# Patient Record
Sex: Female | Born: 1955 | Race: White | Hispanic: No | Marital: Single | Smoking: Never smoker
Health system: Southern US, Community
[De-identification: ages and names within clinical notes are randomized; demographics above are authoritative.]

## PROBLEM LIST (undated history)

## (undated) DIAGNOSIS — K900 Celiac disease: Secondary | ICD-10-CM

## (undated) DIAGNOSIS — M810 Age-related osteoporosis without current pathological fracture: Secondary | ICD-10-CM

## (undated) DIAGNOSIS — Z780 Asymptomatic menopausal state: Secondary | ICD-10-CM

## (undated) DIAGNOSIS — M1990 Unspecified osteoarthritis, unspecified site: Secondary | ICD-10-CM

## (undated) DIAGNOSIS — D509 Iron deficiency anemia, unspecified: Secondary | ICD-10-CM

## (undated) DIAGNOSIS — N200 Calculus of kidney: Secondary | ICD-10-CM

## (undated) DIAGNOSIS — R011 Cardiac murmur, unspecified: Secondary | ICD-10-CM

## (undated) DIAGNOSIS — N3281 Overactive bladder: Secondary | ICD-10-CM

## (undated) HISTORY — DX: Age-related osteoporosis without current pathological fracture: M81.0

## (undated) HISTORY — PX: CHOLECYSTECTOMY: SHX55

## (undated) HISTORY — PX: ULNAR NERVE TRANSPOSITION: SHX2595

## (undated) HISTORY — DX: Overactive bladder: N32.81

## (undated) HISTORY — DX: Unspecified osteoarthritis, unspecified site: M19.90

## (undated) HISTORY — DX: Asymptomatic menopausal state: Z78.0

## (undated) HISTORY — DX: Calculus of kidney: N20.0

## (undated) HISTORY — DX: Iron deficiency anemia, unspecified: D50.9

---

## 2014-02-18 ENCOUNTER — Emergency Department: Payer: Self-pay | Admitting: Internal Medicine

## 2014-03-01 ENCOUNTER — Emergency Department: Payer: Self-pay | Admitting: Emergency Medicine

## 2014-08-13 ENCOUNTER — Encounter (HOSPITAL_COMMUNITY): Payer: Self-pay | Admitting: *Deleted

## 2014-08-13 ENCOUNTER — Inpatient Hospital Stay (HOSPITAL_COMMUNITY)
Admission: EM | Admit: 2014-08-13 | Discharge: 2014-08-18 | DRG: 965 | Disposition: A | Discharge status: Home Health | Payer: Worker's Compensation | Source: Other Acute Inpatient Hospital | Attending: General Surgery | Admitting: General Surgery

## 2014-08-13 ENCOUNTER — Emergency Department: Payer: Self-pay | Admitting: Emergency Medicine

## 2014-08-13 ENCOUNTER — Inpatient Hospital Stay (HOSPITAL_COMMUNITY): Payer: Worker's Compensation

## 2014-08-13 DIAGNOSIS — S3723XA Laceration of bladder, initial encounter: Secondary | ICD-10-CM | POA: Diagnosis present

## 2014-08-13 DIAGNOSIS — W3189XA Contact with other specified machinery, initial encounter: Secondary | ICD-10-CM

## 2014-08-13 DIAGNOSIS — R102 Pelvic and perineal pain: Secondary | ICD-10-CM | POA: Diagnosis present

## 2014-08-13 DIAGNOSIS — S32810A Multiple fractures of pelvis with stable disruption of pelvic ring, initial encounter for closed fracture: Secondary | ICD-10-CM

## 2014-08-13 DIAGNOSIS — D509 Iron deficiency anemia, unspecified: Secondary | ICD-10-CM | POA: Diagnosis present

## 2014-08-13 DIAGNOSIS — S3720XA Unspecified injury of bladder, initial encounter: Secondary | ICD-10-CM | POA: Diagnosis present

## 2014-08-13 DIAGNOSIS — IMO0002 Reserved for concepts with insufficient information to code with codable children: Secondary | ICD-10-CM

## 2014-08-13 DIAGNOSIS — K900 Celiac disease: Secondary | ICD-10-CM | POA: Diagnosis present

## 2014-08-13 DIAGNOSIS — S3219XA Other fracture of sacrum, initial encounter for closed fracture: Secondary | ICD-10-CM | POA: Diagnosis present

## 2014-08-13 DIAGNOSIS — R319 Hematuria, unspecified: Secondary | ICD-10-CM | POA: Diagnosis present

## 2014-08-13 DIAGNOSIS — S3282XA Multiple fractures of pelvis without disruption of pelvic ring, initial encounter for closed fracture: Secondary | ICD-10-CM | POA: Diagnosis present

## 2014-08-13 DIAGNOSIS — S329XXA Fracture of unspecified parts of lumbosacral spine and pelvis, initial encounter for closed fracture: Secondary | ICD-10-CM

## 2014-08-13 DIAGNOSIS — S3991XA Unspecified injury of abdomen, initial encounter: Secondary | ICD-10-CM | POA: Diagnosis present

## 2014-08-13 HISTORY — DX: Celiac disease: K90.0

## 2014-08-13 HISTORY — DX: Cardiac murmur, unspecified: R01.1

## 2014-08-13 HISTORY — DX: Multiple fractures of pelvis without disruption of pelvic ring, initial encounter for closed fracture: S32.82XA

## 2014-08-13 LAB — CBC WITH DIFFERENTIAL/PLATELET
BASOS ABS: 0 10*3/uL (ref 0.0–0.1)
Basophil %: 0.2 %
EOS PCT: 0.1 %
Eosinophil #: 0 10*3/uL (ref 0.0–0.7)
HCT: 25 % — ABNORMAL LOW (ref 35.0–47.0)
HGB: 7.1 g/dL — ABNORMAL LOW (ref 12.0–16.0)
Lymphocyte #: 0.6 10*3/uL — ABNORMAL LOW (ref 1.0–3.6)
Lymphocyte %: 3 %
MCH: 19.2 pg — ABNORMAL LOW (ref 26.0–34.0)
MCHC: 28.6 g/dL — ABNORMAL LOW (ref 32.0–36.0)
MCV: 67 fL — ABNORMAL LOW (ref 80–100)
MONOS PCT: 5 %
Monocyte #: 1 x10 3/mm — ABNORMAL HIGH (ref 0.2–0.9)
NEUTROS PCT: 91.7 %
Neutrophil #: 18.2 10*3/uL — ABNORMAL HIGH (ref 1.4–6.5)
PLATELETS: 382 10*3/uL (ref 150–440)
RBC: 3.72 10*6/uL — AB (ref 3.80–5.20)
RDW: 20 % — ABNORMAL HIGH (ref 11.5–14.5)
WBC: 19.8 10*3/uL — AB (ref 3.6–11.0)

## 2014-08-13 LAB — BASIC METABOLIC PANEL
Anion Gap: 7 (ref 7–16)
BUN: 19 mg/dL — AB (ref 7–18)
CREATININE: 0.85 mg/dL (ref 0.60–1.30)
Calcium, Total: 7.8 mg/dL — ABNORMAL LOW (ref 8.5–10.1)
Chloride: 109 mmol/L — ABNORMAL HIGH (ref 98–107)
Co2: 24 mmol/L (ref 21–32)
EGFR (African American): >60
EGFR (Non-African Amer.): >60
GLUCOSE: 132 mg/dL — AB (ref 65–99)
OSMOLALITY: 284 (ref 275–301)
Potassium: 3.5 mmol/L (ref 3.5–5.1)
Sodium: 140 mmol/L (ref 136–145)

## 2014-08-13 LAB — PROTIME-INR
INR: 1.1
Prothrombin Time: 13.8 secs (ref 11.5–14.7)

## 2014-08-13 LAB — APTT: Activated PTT: 25.9 secs (ref 23.6–35.9)

## 2014-08-13 LAB — MRSA PCR SCREENING: MRSA by PCR: POSITIVE — AB

## 2014-08-13 MED ORDER — OXYCODONE HCL 5 MG PO TABS
5.0000 mg | ORAL_TABLET | ORAL | Status: DC | PRN
  Administered 2014-08-13: 15 mg via ORAL
  Administered 2014-08-13: 10 mg via ORAL
  Administered 2014-08-13: 5 mg via ORAL
  Administered 2014-08-14 – 2014-08-15 (×4): 10 mg via ORAL
  Administered 2014-08-15 – 2014-08-17 (×10): 15 mg via ORAL
  Administered 2014-08-17 – 2014-08-18 (×2): 10 mg via ORAL
  Administered 2014-08-18 (×2): 15 mg via ORAL
  Administered 2014-08-18: 10 mg via ORAL
  Filled 2014-08-13 (×4): qty 3
  Filled 2014-08-13 (×2): qty 2
  Filled 2014-08-13 (×4): qty 3
  Filled 2014-08-13: qty 1
  Filled 2014-08-13: qty 2
  Filled 2014-08-13 (×4): qty 3
  Filled 2014-08-13 (×2): qty 2
  Filled 2014-08-13: qty 3
  Filled 2014-08-13 (×2): qty 2
  Filled 2014-08-13: qty 3

## 2014-08-13 MED ORDER — ENOXAPARIN SODIUM 40 MG/0.4ML ~~LOC~~ SOLN
40.0000 mg | SUBCUTANEOUS | Status: DC
  Administered 2014-08-14 – 2014-08-18 (×5): 40 mg via SUBCUTANEOUS
  Filled 2014-08-13 (×5): qty 0.4

## 2014-08-13 MED ORDER — PANTOPRAZOLE SODIUM 40 MG PO TBEC
40.0000 mg | DELAYED_RELEASE_TABLET | Freq: Every day | ORAL | Status: DC
  Administered 2014-08-13 – 2014-08-14 (×2): 40 mg via ORAL
  Filled 2014-08-13 (×2): qty 1

## 2014-08-13 MED ORDER — ONDANSETRON HCL 4 MG/2ML IJ SOLN
4.0000 mg | Freq: Four times a day (QID) | INTRAMUSCULAR | Status: DC | PRN
  Administered 2014-08-14 – 2014-08-17 (×3): 4 mg via INTRAVENOUS
  Filled 2014-08-13 (×3): qty 2

## 2014-08-13 MED ORDER — SODIUM CHLORIDE 0.9 % IV SOLN
25.0000 mg | Freq: Once | INTRAVENOUS | Status: AC
  Administered 2014-08-13: 25 mg via INTRAVENOUS
  Filled 2014-08-13: qty 0.5

## 2014-08-13 MED ORDER — POTASSIUM CHLORIDE IN NACL 20-0.45 MEQ/L-% IV SOLN
INTRAVENOUS | Status: DC
  Administered 2014-08-13: 50 mL via INTRAVENOUS
  Administered 2014-08-14: 10:00:00 via INTRAVENOUS
  Filled 2014-08-13 (×2): qty 1000

## 2014-08-13 MED ORDER — SODIUM CHLORIDE 0.9 % IV SOLN
1000.0000 mg | Freq: Once | INTRAVENOUS | Status: AC
  Administered 2014-08-13: 1000 mg via INTRAVENOUS
  Filled 2014-08-13: qty 20

## 2014-08-13 MED ORDER — PANTOPRAZOLE SODIUM 40 MG IV SOLR
40.0000 mg | Freq: Every day | INTRAVENOUS | Status: DC
  Filled 2014-08-13: qty 40

## 2014-08-13 MED ORDER — ONDANSETRON HCL 4 MG PO TABS: 4.0000 mg | ORAL_TABLET | Freq: Four times a day (QID) | ORAL | Status: DC | PRN

## 2014-08-13 MED ORDER — DOCUSATE SODIUM 100 MG PO CAPS
100.0000 mg | ORAL_CAPSULE | Freq: Two times a day (BID) | ORAL | Status: DC
Start: 2014-08-13 — End: 2014-08-18
  Administered 2014-08-13 – 2014-08-18 (×10): 100 mg via ORAL
  Filled 2014-08-13 (×11): qty 1

## 2014-08-13 MED ORDER — MORPHINE SULFATE 2 MG/ML IJ SOLN
2.0000 mg | INTRAMUSCULAR | Status: DC | PRN
  Administered 2014-08-13 – 2014-08-15 (×5): 2 mg via INTRAVENOUS
  Filled 2014-08-13 (×5): qty 1

## 2014-08-13 NOTE — H&P (Signed)
Carla Cortez is an 59 y.o. female.   Chief Complaint: Pelvic fxs HPI: Carla Cortez was working with a Publishing copy and was having trouble unloading the pallet. She somehow ended up pinned between the jack and a semi. She did not fall or lose consciousness. She was helped to a chair after the incident but did not try to ambulate. She was evaluated at [facility] where she was found to have multiple pelvic fxs and an anemia. She states that she regularly gets anemic secondary to her celiac disease.  She generally has to get iron infusions and knew it was getting time to get one because she was starting to have some exertional dyspnea. She has recently relocated to [facility] and had not yet established with a PCP here.  Past Medical History  Diagnosis Date  . Heart murmur   . Celiac disease     Past Surgical History  Procedure Laterality Date  . Cesarean section      History reviewed. No pertinent family history. Social History:  reports that she has never smoked. She does not have any smokeless tobacco history on file. She reports that she does not drink alcohol. Her drug history is not on file.  Allergies:  Allergies  Allergen Reactions  . Gluten Meal Nausea And Vomiting  . Wheat Bran Nausea And Vomiting    Review of Systems  Constitutional: Negative for weight loss.  HENT: Negative for ear discharge, ear pain, hearing loss and tinnitus.   Eyes: Negative for blurred vision, double vision, photophobia and pain.  Respiratory: Positive for shortness of breath. Negative for cough and sputum production.   Cardiovascular: Negative for chest pain.  Gastrointestinal: Negative for nausea, vomiting and abdominal pain.  Genitourinary: Negative for dysuria, urgency, frequency and flank pain.  Musculoskeletal: Positive for joint pain (Pelvis, LLE). Negative for myalgias, back pain, falls and neck pain.  Neurological: Negative for dizziness, tingling, sensory change, focal weakness, loss of consciousness and  headaches.  Endo/Heme/Allergies: Does not bruise/bleed easily.  Psychiatric/Behavioral: Negative for depression, memory loss and substance abuse. The patient is not nervous/anxious.     Blood pressure 110/76, temperature 100.4 F (38 C), temperature source Oral. Physical Exam  Vitals reviewed. Constitutional: She is oriented to person, place, and time. She appears well-developed and well-nourished. She is cooperative. No distress. Cervical collar and nasal cannula in place.  HENT:  Head: Normocephalic and atraumatic. Head is without raccoon's eyes, without Battle's sign, without abrasion, without contusion and without laceration.  Right Ear: Hearing, tympanic membrane and ear canal normal. No lacerations. No drainage or tenderness. No foreign bodies. Tympanic membrane is not perforated. No hemotympanum.  Left Ear: Hearing, tympanic membrane and ear canal normal. No lacerations. No drainage or tenderness. No foreign bodies. Tympanic membrane is not perforated. No hemotympanum.  Nose: Nose normal. No nose lacerations, sinus tenderness, nasal deformity or nasal septal hematoma. No epistaxis.  Mouth/Throat: Uvula is midline, oropharynx is clear and moist and mucous membranes are normal. No lacerations. No oropharyngeal exudate.  Eyes: Conjunctivae, EOM and lids are normal. Pupils are equal, round, and reactive to light. Right eye exhibits no discharge. Left eye exhibits no discharge. No scleral icterus.  Neck: Trachea normal and normal range of motion. Neck supple. No JVD present. No spinous process tenderness and no muscular tenderness present. Carotid bruit is not present. No tracheal deviation present. No thyromegaly present.  Cardiovascular: Normal rate, regular rhythm, intact distal pulses and normal pulses.  Exam reveals no gallop and no friction rub.  Murmur heard. Respiratory: Effort normal and breath sounds normal. No stridor. No respiratory distress. She has no wheezes. She has no rales.  She exhibits no tenderness, no bony tenderness, no laceration and no crepitus.  GI: Soft. Normal appearance and bowel sounds are normal. She exhibits no distension. There is tenderness in the suprapubic area. There is no rigidity, no rebound, no guarding and no CVA tenderness.  Genitourinary: Vagina normal.  Musculoskeletal: Normal range of motion. She exhibits no edema or tenderness.  Lymphadenopathy:    She has no cervical adenopathy.  Neurological: She is alert and oriented to person, place, and time. She has normal strength. No cranial nerve deficit or sensory deficit. GCS eye subscore is 4. GCS verbal subscore is 5. GCS motor subscore is 6.  Skin: Skin is warm, dry and intact. She is not diaphoretic.  Psychiatric: She has a normal mood and affect. Her speech is normal and behavior is normal.     Assessment/Plan Blunt pelvic trauma -- Ortho to consult in the morning, bedrest for now. Iron deficiency anemia -- Getting transfusion now, will give Fe FEN -- Clears VTE -- SCD's, Lovenox Dispo -- Ortho consult in AM    Carla CaldronMichael J. Vanden Fawaz, PA-C Pager: [phone] General Trauma PA Pager: [phone] 08/13/2014, 1:32 PM

## 2014-08-13 NOTE — Progress Notes (Signed)
Admitted via care link to room 3S 01 with blood transfusing.  Patient with complaint of pain in pelvic area, no other complaints.  Earney HamburgMichael [facility] notified and will see.

## 2014-08-13 NOTE — Consult Note (Signed)
Reason for Consult:Pelvic fractures Referring Physician: Trauma service  Carla Cortez is an 59 y.o. female.  HPI: The patient is a 59 year old female who was trapped between a palette jack and the wall of a moving truck.  She was taken to [facility] where she was evaluated in the emergency room and the orthopedist on call and was incapable of taking care of her.  We are consult it and asked for transfer.  The emergency room physician described the fracture pattern to me and mechanism of injury and I was concerned that this could be a significant problem.  I was not able to view the films of this time.  He stated that she was hemodynamically fine.  He at that moment checked her labs and realized that her hemoglobin was slightly above 7.  At that point we both agreed she needed to be a trauma transfer.  She was transferred to [facility] for evaluation and treatment by the trauma service and we are consult it for treatment of her pelvic fractures.  Past Medical History  Diagnosis Date  . Heart murmur   . Celiac disease     Past Surgical History  Procedure Laterality Date  . Cesarean section      History reviewed. No pertinent family history.  Social History:  reports that she has never smoked. She does not have any smokeless tobacco history on file. She reports that she does not drink alcohol. Her drug history is not on file.  Allergies:  Allergies  Allergen Reactions  . Gluten Meal Nausea And Vomiting  . Wheat Bran Nausea And Vomiting    Medications: I have reviewed the patient's current medications.  Results for orders placed or performed during the hospital encounter of 08/13/14 (from the past 48 hour(s))  MRSA PCR Screening     Status: Abnormal   Collection Time: 08/13/14  1:08 PM  Result Value Ref Range   MRSA by PCR POSITIVE (A) NEGATIVE    Comment:        The GeneXpert MRSA Assay (FDA approved for NASAL specimens only), is one component of a comprehensive  MRSA colonization surveillance program. It is not intended to diagnose MRSA infection nor to guide or monitor treatment for MRSA infections. RESULT CALLED TO, READ BACK BY AND VERIFIED WITH: Maia Breslow RN 15:45 08/13/14 ([email or web address])     Dg Pelvis Comp Min 3v  08/13/2014   CLINICAL DATA:  59 year old female with known pelvic fractures identified on CT of the same date.  EXAM: JUDET PELVIS - 3+ VIEW  COMPARISON:  CT 08/13/2014, pelvic image 120 08/13/2014  FINDINGS: Three views of the pelvis, again demonstrating superior and inferior pubic rami fractures bilaterally.  Left sacral fracture and left iliac fracture posteriorly, extending to the sacroiliac joint. All of these fractures are better demonstrated on prior CT, and appear unchanged in configuration when compared to the most recent plain film.  Unremarkable appearance of the proximal femurs.  IMPRESSION: Three views of the pelvis, again demonstrating left sacral fracture, left iliac fracture extending to the left SI joint, and bilateral superior and inferior pubic rami fractures. Appearance is relatively unchanged to the comparison studies, though better characterized on recent CT.  Signed,  Yvone Neu. Loreta Ave, DO  [facility]  [facility]   Electronically Signed   By: Gilmer Mor D.O.   On: 08/13/2014 18:55    ROS  ROS: I have reviewed the patient's review of systems thoroughly and there are no  positive responses as relates to the HPI. EXAM: Blood pressure 114/55, pulse 85, temperature 99.1 F (37.3 C), temperature source Oral, resp. rate 15, SpO2 95 %. Physical Exam Well-developed well-nourished patient in no acute distress. Alert and oriented x3 HEENT:within normal limits Cardiac: Regular rate and rhythm Pulmonary: Lungs clear to auscultation Abdomen: Soft and nontender.  Normal active bowel sounds  Musculoskeletal: Upper extremity exam shows full range of motion of shoulders, elbows, and  wrists.  There is no tenderness to palpation in the upper extremities.  Lower extremity examination shows full range of motion of the knees and ankles.  She does have some pain with range of motion of her hips.  She has positive heel strike bilaterally.  Assessment/Plan: 59 year old female who suffered what sounds like a lateral compression type pelvic fracture.  She has bilateral superior and inferior pubic ramus fractures with minimal displacement.  She has a left sacral alar fracture with minimal displacement.  At this point even though she has significant decreased hemoglobin I don't feel that any surgical intervention is indicated.  I will try to mobilize her gently over the next several days and if for some reason she was incapable of immobilization might discuss the case with Dr. Myrene GalasMichael Handy.I feel that she will likely will be able to mobilize and will not need surgical intervention.  I will follow her while in the hospital.  Abad Manard L 08/13/2014, 7:41 PM

## 2014-08-13 NOTE — Progress Notes (Signed)
UR completed.  Ibrohim Simmers, RN BSN MHA CCM Trauma/Neuro ICU Case Manager [phone]  

## 2014-08-14 ENCOUNTER — Encounter (HOSPITAL_COMMUNITY): Payer: Self-pay | Admitting: Radiology

## 2014-08-14 ENCOUNTER — Inpatient Hospital Stay (HOSPITAL_COMMUNITY): Payer: Worker's Compensation

## 2014-08-14 DIAGNOSIS — D509 Iron deficiency anemia, unspecified: Secondary | ICD-10-CM | POA: Diagnosis present

## 2014-08-14 DIAGNOSIS — S3991XA Unspecified injury of abdomen, initial encounter: Secondary | ICD-10-CM | POA: Diagnosis present

## 2014-08-14 DIAGNOSIS — K900 Celiac disease: Secondary | ICD-10-CM | POA: Insufficient documentation

## 2014-08-14 DIAGNOSIS — S3720XA Unspecified injury of bladder, initial encounter: Secondary | ICD-10-CM | POA: Diagnosis present

## 2014-08-14 LAB — BASIC METABOLIC PANEL
Anion gap: 9 (ref 5–15)
BUN: 11 mg/dL (ref 6–23)
CO2: 20 mmol/L (ref 19–32)
Calcium: 8.5 mg/dL (ref 8.4–10.5)
Chloride: 107 mEq/L (ref 96–112)
Creatinine, Ser: 0.68 mg/dL (ref 0.50–1.10)
Glucose, Bld: 110 mg/dL — ABNORMAL HIGH (ref 70–99)
Potassium: 4.7 mmol/L (ref 3.5–5.1)
SODIUM: 136 mmol/L (ref 135–145)

## 2014-08-14 LAB — CBC
HCT: 27.4 % — ABNORMAL LOW (ref 36.0–46.0)
Hemoglobin: 8.2 g/dL — ABNORMAL LOW (ref 12.0–15.0)
MCH: 21.6 pg — ABNORMAL LOW (ref 26.0–34.0)
MCHC: 29.9 g/dL — AB (ref 30.0–36.0)
MCV: 72.1 fL — AB (ref 78.0–100.0)
PLATELETS: 227 10*3/uL (ref 150–400)
RBC: 3.8 MIL/uL — ABNORMAL LOW (ref 3.87–5.11)
RDW: 20.1 % — ABNORMAL HIGH (ref 11.5–15.5)
WBC: 5.8 10*3/uL (ref 4.0–10.5)

## 2014-08-14 MED ORDER — HYOSCYAMINE SULFATE 0.125 MG SL SUBL
0.1250 mg | SUBLINGUAL_TABLET | SUBLINGUAL | Status: DC | PRN
  Filled 2014-08-14: qty 1

## 2014-08-14 MED ORDER — POLYETHYLENE GLYCOL 3350 17 G PO PACK
17.0000 g | PACK | Freq: Every day | ORAL | Status: DC
  Administered 2014-08-14 – 2014-08-18 (×5): 17 g via ORAL
  Filled 2014-08-14 (×5): qty 1

## 2014-08-14 MED ORDER — IOHEXOL 300 MG/ML  SOLN
100.0000 mL | Freq: Once | INTRAMUSCULAR | Status: AC | PRN
  Administered 2014-08-14: 1 mL via INTRAVENOUS

## 2014-08-14 NOTE — Consult Note (Signed)
Urology Consult   Physician requesting consult: Jimmye Norman  Reason for consult: Bladder rupture  History of Present Illness: Carla Cortez is a 59 y.o. cauc female with PMH significant for anemia and celiac disease who was admitted yesterday for treatment of multiple pelvic fractures.  She was trapped between a palette jack and the wall of a moving truck.  She was initially evaled at [facility] and then transferred to [facility].  Ortho has evaled the pt and does not feel she needs surgical intervention at this time.  After admission she was noted to have hematuria which prompted a CT abd/pelvis. This revealed an increase in the extraperitoneal fluid in the pelvis with the presence of gas bubbles near the bladder suggesting a possible bladder laceration with leak. The pt has had a foley in place since admission.  Hematuria has resolved.  She has remained hemodynamically stable.  She is currently resting comfortably and only has pain when she "moves a lot".  She denies F/C, HA, SOB,  N/V, abdominal pain, and bladder spasms.  She denies a history of voiding or storage urinary symptoms (except mild stress incontinence with heavy coughing), hematuria, STDs, and GU malignancy/trauma/surgery.  She did have issues with recurrent UTIs in the past but has been infection free for "over 5 years as far as she knows".  She also had three episodes of kidney stones in the past but the last stone was over 3 years ago.    Past Medical History  Diagnosis Date  . Heart murmur   . Celiac disease     Past Surgical History  Procedure Laterality Date  . Cesarean section       Current Hospital Medications:  Home meds:    Medication List    ASK your doctor about these medications        aspirin 81 MG tablet  Take 81 mg by mouth daily.     multivitamin with minerals tablet  Take 1 tablet by mouth daily.     STRESS FORMULA Tabs  Take 1 tablet by mouth daily.        Scheduled Meds: . docusate sodium  100 mg Oral  BID  . enoxaparin (LOVENOX) injection  40 mg Subcutaneous Q24H  . polyethylene glycol  17 g Oral Daily   Continuous Infusions:  PRN Meds:.morphine injection, ondansetron **OR** ondansetron (ZOFRAN) IV, oxyCODONE  Allergies:  Allergies  Allergen Reactions  . Gluten Meal Nausea And Vomiting  . Wheat Bran Nausea And Vomiting    History reviewed. No pertinent family history.  Social History:  reports that she has never smoked. She does not have any smokeless tobacco history on file. She reports that she does not drink alcohol. Her drug history is not on file.  ROS: A complete review of systems was performed.  All systems are negative except for pertinent findings as noted.  Physical Exam:  Vital signs in last 24 hours: Temp:  [98.5 F (36.9 C)-99.1 F (37.3 C)] 98.5 F (36.9 C) (01/21 1449) Pulse Rate:  [75-85] 83 (01/21 0706) Resp:  [11-19] 19 (01/21 0706) BP: (103-123)/(55-78) 103/78 mmHg (01/21 0706) SpO2:  [92 %-97 %] 92 % (01/21 0706) Weight:  [65.8 kg (145 lb 1 oz)] 65.8 kg (145 lb 1 oz) (01/21 1100) Constitutional:  Alert and oriented, No acute distress Cardiovascular: Regular rate and rhythm Respiratory: Normal respiratory effort GI: Abdomen is soft, nontender, nondistended, no abdominal masses GU: 21f foley in place draining clear/yellow urine Lymphatic: No lymphadenopathy Neurologic: Grossly intact, no  focal deficits Psychiatric: Normal mood and affect  Laboratory Data:   Recent Labs  08/14/14 0306  WBC 5.8  HGB 8.2*  HCT 27.4*  PLT 227     Recent Labs  08/14/14 0306  NA 136  K 4.7  CL 107  GLUCOSE 110*  BUN 11  CALCIUM 8.5  CREATININE 0.68     Results for orders placed or performed during the hospital encounter of 08/13/14 (from the past 24 hour(s))  CBC     Status: Abnormal   Collection Time: 08/14/14  3:06 AM  Result Value Ref Range   WBC 5.8 4.0 - 10.5 K/uL   RBC 3.80 (L) 3.87 - 5.11 MIL/uL   Hemoglobin 8.2 (L) 12.0 - 15.0 g/dL   HCT  16.1 (L) 09.6 - 46.0 %   MCV 72.1 (L) 78.0 - 100.0 fL   MCH 21.6 (L) 26.0 - 34.0 pg   MCHC 29.9 (L) 30.0 - 36.0 g/dL   RDW 04.5 (H) 40.9 - 81.1 %   Platelets 227 150 - 400 K/uL  Basic metabolic panel     Status: Abnormal   Collection Time: 08/14/14  3:06 AM  Result Value Ref Range   Sodium 136 135 - 145 mmol/L   Potassium 4.7 3.5 - 5.1 mmol/L   Chloride 107 96 - 112 mEq/L   CO2 20 19 - 32 mmol/L   Glucose, Bld 110 (H) 70 - 99 mg/dL   BUN 11 6 - 23 mg/dL   Creatinine, Ser 9.14 0.50 - 1.10 mg/dL   Calcium 8.5 8.4 - 78.2 mg/dL   GFR calc non Af Amer >90 >90 mL/min   GFR calc Af Amer >90 >90 mL/min   Anion gap 9 5 - 15   Recent Results (from the past 240 hour(s))  MRSA PCR Screening     Status: Abnormal   Collection Time: 08/13/14  1:08 PM  Result Value Ref Range Status   MRSA by PCR POSITIVE (A) NEGATIVE Final    Comment:        The GeneXpert MRSA Assay (FDA approved for NASAL specimens only), is one component of a comprehensive MRSA colonization surveillance program. It is not intended to diagnose MRSA infection nor to guide or monitor treatment for MRSA infections. RESULT CALLED TO, READ BACK BY AND VERIFIED WITH: Maia Breslow RN 15:45 08/13/14 ([email or web address])     Renal Function:  Recent Labs  08/14/14 0306  CREATININE 0.68   Estimated Creatinine Clearance: 71.5 mL/min (by C-G formula based on Cr of 0.68).  Radiologic Imaging: Ct Abdomen Pelvis W Contrast  08/14/2014   CLINICAL DATA:  trapped between a pallet jack and the wall of a moving truck. She was transferred here from [facility] and complains of pelvic pain as a result of pelvic fractures. Patient had Hgb slightly above 7 yesterday and hematuria; she became a trauma transfer to [facility].No N/V/D. H/o celiac disease and anemia. C-section x1. Patient states hematuria has improved since yesterday, but stated there was concern for her bladder  EXAM: CT ABDOMEN AND PELVIS WITH CONTRAST  TECHNIQUE: Multidetector CT imaging of the  abdomen and pelvis was performed using the standard protocol following bolus administration of intravenous contrast.  CONTRAST:  1mL OMNIPAQUE IOHEXOL 300 MG/ML  SOLN  COMPARISON:  CT 08/13/2014  FINDINGS: Dependent atelectasis/ consolidation in the visualized lung bases. No effusion. Unremarkable liver, gallbladder, spleen, adrenal glands, pancreas, right kidney. Subcentimeter probable cyst in the interpolar region left kidney. No hydronephrosis. Stomach, small bowel, and colon are  nondilated. Uterus and adnexal regions unremarkable.  There is a moderate amount of extraperitoneal fluid in the pelvis bilaterally extending into the presacral space, increased since previous exam. There are new scattered gas bubbles anterior to the urinary bladder. The urinary bladder is incompletely distended and thick walled, partially decompressed by Foley catheter. Fractures of the left sacral ala, left ilium, and bilateral superior and inferior ischial pubic rami. Hips are intact. Lumbar spine intact.  IMPRESSION: 1. Increase in extraperitoneal fluid in the pelvis which may represent progressive hemorrhage although presence of gas bubbles near the urinary bladder suggest possible bladder laceration in leak. Delayed images were not obtained to assess for any contrast leakage outside of the urinary bladder. 2. Left sacral, left iliac, and bilateral ischiopubic rami fractures as previously described.   Electronically Signed   By: Oley Balmaniel  Hassell M.D.   On: 08/14/2014 13:15   Dg Pelvis Comp Min 3v  08/13/2014   CLINICAL DATA:  59 year old female with known pelvic fractures identified on CT of the same date.  EXAM: JUDET PELVIS - 3+ VIEW  COMPARISON:  CT 08/13/2014, pelvic image 120 08/13/2014  FINDINGS: Three views of the pelvis, again demonstrating superior and inferior pubic rami fractures bilaterally.  Left sacral fracture and left iliac fracture posteriorly, extending to the sacroiliac joint. All of these fractures are better  demonstrated on prior CT, and appear unchanged in configuration when compared to the most recent plain film.  Unremarkable appearance of the proximal femurs.  IMPRESSION: Three views of the pelvis, again demonstrating left sacral fracture, left iliac fracture extending to the left SI joint, and bilateral superior and inferior pubic rami fractures. Appearance is relatively unchanged to the comparison studies, though better characterized on recent CT.  Signed,  Yvone NeuJaime S. Loreta AveWagner, DO  [facility]  [facility]   Electronically Signed   By: Gilmer MorJaime  Wagner D.O.   On: 08/13/2014 18:55    Impression/Recommendation: Extraperitoneal bladder rupture--  Surgical intervention is not necessary in this pt since she will not undergo ortho procedures and there is no evidence of bone spicules in the bladder wall.    Keep foley in place for at least 2 weeks and pt should be maintained on Abx while she has catheter.  Pt will need cystogram prior to foley removal to determine if leak has healed.  This can be done as an inpatient or in our office as an outpt.    DANCY, AMANDA 08/14/2014, 3:45 PM    Addendum: Patient was seen and examined today. Her chart was reviewed and her CT scan images independently reviewed. I agree with the above. She seems to be doing pretty well overall. Based on her CT scan findings I am quite certain she has an extraperitoneal bladder rupture. In a female who is not requiring any pelvic surgery for other reasons with no evidence of bone spicules near the bladder wall surgical intervention for closure is not necessary. This can be managed with Foley catheter drainage alone. She will maintain her Foley catheter for 2 weeks and then when she returns to my office I will perform a cystogram. I have discussed this with her. While an inpatient and upon discharge I would recommend she remain on antibiotic therapy.  She also reports that she has had a long  history of urinary frequency and nocturia. This places her at increased risk for bladder spasms with the Foley catheter indwelling and therefore I will place an order for a PRN antispasmodic. When she  follows up in the office we will discuss longer term therapy for her bladder symptoms which are suggestive of overactive bladder.  I have placed her information for her to contact my office on the chart to be given to read the time of discharge.

## 2014-08-14 NOTE — Evaluation (Signed)
Occupational Therapy Evaluation Patient Details Name: Carla Cortez MRN: [id number] DOB: [birth year] Today's Date: 08/14/2014    History of Present Illness Patient is a 59 y/o female w/ PMH significant for anemia and celiac disease who was admitted yesterday for treatment of multiple pelvic fractures. Pt was trapped between a palette jack and the wall of a moving truck. CT abd/pelvis-increase in the extraperitoneal fluid in the pelvis with the presence of gas bubbles near the bladder suggesting a possible bladder laceration with leak. Hematuria resolved. Pelvis XRay-left sacral fracture, left iliac fracture extending to the left SI joint, and bil sup/inf pubic ramus fx.   Clinical Impression   PTA pt was independent with ADLs and IADLs. Pt is highly motivated to return to her active lifestyle, however is limited by pain and nausea during mobility this date. Pt required Mod (A) +2 to stand at EOB and requires max/total (A) for LB ADLs at this time. Pt will benefit from acute OT to address ADLs and functional transfers.     Follow Up Recommendations  Home health OT;Supervision/Assistance - 24 hour    Equipment Recommendations  3 in 1 bedside comode    Recommendations for Other Services       Precautions / Restrictions Precautions Precautions: Fall Restrictions Weight Bearing Restrictions: Yes RLE Weight Bearing: Weight bearing as tolerated LLE Weight Bearing: Weight bearing as tolerated      Mobility Bed Mobility Overal bed mobility: Needs Assistance Bed Mobility: Supine to Sit;Sit to Supine     Supine to sit: Max assist;+2 for physical assistance;HOB elevated Sit to supine: Max assist;+2 for physical assistance;HOB elevated   General bed mobility comments: Cues for technique. Use of rails. Assist with mobilizing BLEs, bottom and trunk to EOB. Increased time due to pain. Assist returning trunk and BLEs to bed. + nausea.  Transfers Overall transfer level: Needs  assistance Equipment used: 2 person hand held assist Transfers: Sit to/from Stand Sit to Stand: Mod assist;+2 physical assistance;From elevated surface         General transfer comment: Mod A of 2 to rise from bed with cues for hand placement and anterior translation. Manual cues for upright posture and increased WB through BUEs. + nausea.    Balance Overall balance assessment: Needs assistance Sitting-balance support: Single extremity supported;Feet supported Sitting balance-Leahy Scale: Fair Sitting balance - Comments: Able to perform static sitting with UE support as position of comfort.   Standing balance support: Bilateral upper extremity supported;During functional activity Standing balance-Leahy Scale: Poor Standing balance comment: able to remove single UE during static standing briefly                            ADL Overall ADL's : Needs assistance/impaired Eating/Feeding: Set up;Sitting   Grooming: Wash/dry face;Set up;Sitting Grooming Details (indicate cue type and reason): with UE support Upper Body Bathing: Minimal assitance;Sitting   Lower Body Bathing: Maximal assistance;Sit to/from stand;+2 for physical assistance   Upper Body Dressing : Minimal assistance;Sitting   Lower Body Dressing: Total assistance;+2 for physical assistance;Sit to/from stand   Toilet Transfer: Moderate assistance;+2 for physical assistance Toilet Transfer Details (indicate cue type and reason): sit<>stand from elevated bed with +2 person hand held assist           General ADL Comments: Pt able to stand at EOB with +2 assistance. Pt was able to release RUE from support briefly to wipe her face.     Vision  Pt reports  no change from baseline                   Perception Perception Perception Tested?: No   Praxis Praxis Praxis tested?: Within functional limits    Pertinent Vitals/Pain Pain Assessment: 0-10 Pain Score: 8  Pain Location: Left hip with  movement Pain Descriptors / Indicators: Sharp;Sore Pain Intervention(s): Limited activity within patient's tolerance;Monitored during session;Premedicated before session;Repositioned     Hand Dominance Right   Extremity/Trunk Assessment Upper Extremity Assessment Upper Extremity Assessment: Overall WFL for tasks assessed   Lower Extremity Assessment Lower Extremity Assessment: Defer to PT evaluation    Cervical / Trunk Assessment Cervical / Trunk Assessment: Normal   Communication Communication Communication: No difficulties   Cognition Arousal/Alertness: Awake/alert Behavior During Therapy: WFL for tasks assessed/performed Overall Cognitive Status: Within Functional Limits for tasks assessed                                Home Living Family/patient expects to be discharged to:: Private residence Living Arrangements: Spouse/significant other Available Help at Discharge: Family;Available 24 hours/day Type of Home: House Home Access: Stairs to enter [employer] CorporationEntrance Stairs-Number of Steps: 3 Entrance Stairs-Rails: None Home Layout: One level     Bathroom Shower/Tub: Tub/shower unit;Walk-in shower   Bathroom Toilet: Standard     Home Equipment: None          Prior Functioning/Environment Level of Independence: Independent        Comments: 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 Very active PTA.    OT Diagnosis: Generalized weakness;Acute pain   OT Problem List: Decreased strength;Decreased range of motion;Decreased activity tolerance;Impaired balance (sitting and/or standing);Decreased knowledge of use of DME or AE;Pain   OT Treatment/Interventions: Self-care/ADL training;Therapeutic exercise;Energy conservation;DME and/or AE instruction;Therapeutic activities;Patient/family education;Balance training    OT Goals(Current goals can be found in the care plan section) Acute Rehab OT Goals Patient Stated Goal: to return home  OT Goal Formulation: With  patient Time For Goal Achievement: 08/28/14  OT Frequency: Min 2X/week           Co-evaluation PT/OT/SLP Co-Evaluation/Treatment: Yes Reason for Co-Treatment: For patient/therapist safety   OT goals addressed during session: ADL's and self-care      End of Session Equipment Utilized During Treatment: Gait belt Nurse Communication: Mobility status  Activity Tolerance: Patient limited by pain Patient left: in bed;with call bell/phone within reach;with family/visitor present   Time: [id number] OT Time Calculation (min): 35 min Charges:  OT General Charges $OT Visit: 1 Procedure OT Evaluation $Initial OT Evaluation Tier I: 1 Procedure G-Codes:    Rae LipsMiller, Collier Monica M 08/14/2014, 5:54 PM  Carney LivingLeeAnn Marie Jamiya Nims, OTR/L Occupational Therapist [phone] (pager)

## 2014-08-14 NOTE — Clinical Social Work Note (Signed)
Clinical Social Work Department BRIEF PSYCHOSOCIAL ASSESSMENT 08/14/2014  Patient:  Carla Cortez, Carla Cortez     Account Number:  [id number]     Admit date:  08/13/2014  Clinical Social Worker:  Myles Lipps  Date/Time:  08/14/2014 12:00 N  Referred by:  Physician  Date Referred:  08/14/2014 Referred for  Psychosocial assessment   Other Referral:   Interview type:  Patient Other interview type:    PSYCHOSOCIAL DATA Living Status:  SIGNIFICANT OTHER Admitted from facility:   Level of care:   Primary support name:  Maree Krabbe  [phone] Primary support relationship to patient:  PARTNER Degree of support available:   Adequate    CURRENT CONCERNS Current Concerns  Post-Acute Placement   Other Concerns:    SOCIAL WORK ASSESSMENT / PLAN Clinical Social Worker met with patient at bedside to offer support and discuss patient needs at discharge.  Patient states that she got caught in a piece of machinary at work. Patient states that she does not have a clear understanding of what happened since it happened so quickly.  Patient currently works at [employer] in [place] and [employer] part time (incident happened at [employer]). Patient lives at home with her boyfriend and is planning to return home with him at discharge.  Patient states that her boyfriend has been granted some time off to assist patient as needed at home.    Clinical Social Worker inquired about current substance use.  Patient states that she has an occasional glass of wine but nothing in excess.  SBIRT completed.  No resources needed at this time.  CSW remains available for support and to assist with discharge planning needs pending therapy evaluations.   Assessment/plan status:  Psychosocial Support/Ongoing Assessment of Needs Other assessment/ plan:   Information/referral to community resources:   SBIRT complete.  CSW explained patient potential options for possible discharge plans - patient very agreeable  with all dispostion plans, but hopeful for discharge home.    PATIENT'S/FAMILY'S RESPONSE TO PLAN OF CARE: Patient alert and oriented x3 sitting up in the chair. Patient with good family support from her boyfriend and daughter who also lives locally.  Patient feels that she will be able to return home at discharge.  Patient agreeable with working with therapies to determine most appropriate venue.  Patient verbalized understanding of CSW role and appreciation of  support and concern.

## 2014-08-14 NOTE — Progress Notes (Signed)
Subjective: Feeling better able to stand.  Appreciate urology input.   Objective: Vital signs in last 24 hours: Temp:  [98.5 F (36.9 C)-100 F (37.8 C)] 100 F (37.8 C) (01/21 1900) Pulse Rate:  [74-102] 83 (01/21 1900) Resp:  [11-19] 16 (01/21 1900) BP: (103-135)/(55-78) 105/55 mmHg (01/21 1900) SpO2:  [91 %-97 %] 91 % (01/21 1900) Weight:  [145 lb 1 oz (65.8 kg)] 145 lb 1 oz (65.8 kg) (01/21 1100)  Intake/Output from previous day: 01/20 0701 - 01/21 0700 In: 960 [P.O.:410; I.V.:550] Out: 2850 [Urine:2850] Intake/Output this shift:     Recent Labs  08/14/14 0306  HGB 8.2*    Recent Labs  08/14/14 0306  WBC 5.8  RBC 3.80*  HCT 27.4*  PLT 227    Recent Labs  08/14/14 0306  NA 136  K 4.7  CL 107  CO2 20  BUN 11  CREATININE 0.68  GLUCOSE 110*  CALCIUM 8.5   No results for input(s): LABPT, INR in the last 72 hours.  Neurologically intact ABD soft Neurovascular intact No cellulitis present Compartment soft  Assessment/Plan: Pelvic fracturewith bladder rupture not currently requiring surgery.  Able to stand today.  Will follow but feel if she progresses that she will not require any surgery   Tadeo Besecker L 08/14/2014, 9:40 PM

## 2014-08-14 NOTE — Progress Notes (Signed)
Patient ID: Carla Cortez, female   DOB: January 11, 1956, 59 y.o.   MRN: [id number]   LOS: 1 day   Subjective: Doing pretty well.   Objective: Vital signs in last 24 hours: Temp:  [98.5 F (36.9 C)-100.4 F (38 C)] 98.5 F (36.9 C) (01/21 0706) Pulse Rate:  [75-85] 83 (01/21 0706) Resp:  [11-19] 19 (01/21 0706) BP: (103-129)/(55-78) 103/78 mmHg (01/21 0706) SpO2:  [92 %-100 %] 92 % (01/21 0706) Last BM Date: 08/13/14   Laboratory  CBC  Recent Labs  08/14/14 0306  WBC 5.8  HGB 8.2*  HCT 27.4*  PLT 227   BMET  Recent Labs  08/14/14 0306  NA 136  K 4.7  CL 107  CO2 20  GLUCOSE 110*  BUN 11  CREATININE 0.68  CALCIUM 8.5    Physical Exam General appearance: alert and no distress Resp: clear to auscultation bilaterally Cardio: regular rate and rhythm GI: normal findings: bowel sounds normal and soft, non-tender   Assessment/Plan: Blunt pelvic trauma --  Will get CT abd/pelvis with hematuria yesterday though it's cleared up at this point. Multiple pelvic fxs -- WBAT per Dr. Luiz BlareGraves, PT/OT. Iron deficiency anemia -- Appropriate rise with transfusion yesterday, check CBC in am Celiac disease FEN -- Advance diet VTE -- SCD's, Lovenox Dispo -- PT/OT, transfer to floor    Freeman CaldronMichael J. Othello Sgroi, PA-C Pager: [phone] General Trauma PA Pager: [phone]  08/14/2014

## 2014-08-14 NOTE — Progress Notes (Addendum)
Pt tx [facility] per MD order, pt VSS, pt verbalized understanding of tx, Report called to receiving RN, all questions answered, foley in place due to B Pelvic fx with Bladder displacement

## 2014-08-14 NOTE — Evaluation (Signed)
Physical Therapy Evaluation Patient Details Name: Carla Cortez MRN: [id number] DOB: October 25, 1955 Today's Date: 08/14/2014   History of Present Illness  Patient is a 59 y/o female w/ PMH significant for anemia and celiac disease who was admitted yesterday for treatment of multiple pelvic fractures. Pt was trapped between a palette jack and the wall of a moving truck. CT abd/pelvis-increase in the extraperitoneal fluid in the pelvis with the presence of gas bubbles near the bladder suggesting a possible bladder laceration with leak. Hematuria resolved. Pelvis XRay-left sacral fracture, left iliac fracture extending to the left SI joint, and bil sup/inf pubic ramus fx.    Clinical Impression  Patient presents with functional limitations due to deficits listed in Pt problem list (see below). Pt with generalized weakness, pain with movement and nausea impacting mobility. Tolerated sitting EOB and standing with resultant nausea and hypotension resulting in need to return to supine. All mobility takes increased time due to pain. Pt highly motivated. Pt would benefit from skilled PT to improve transfers, gait, balance and mobility so pt can maximize independence and return to PLOF. Pt prefers to return home as she will have 24/7 A from b/f.    Follow Up Recommendations Home health PT;Supervision/Assistance - 24 hour    Equipment Recommendations  Rolling walker with 5" wheels    Recommendations for Other Services       Precautions / Restrictions Precautions Precautions: Fall Restrictions Weight Bearing Restrictions: Yes RLE Weight Bearing: Weight bearing as tolerated LLE Weight Bearing: Weight bearing as tolerated      Mobility  Bed Mobility Overal bed mobility: Needs Assistance Bed Mobility: Supine to Sit;Sit to Supine     Supine to sit: Max assist;+2 for physical assistance;HOB elevated Sit to supine: Max assist;+2 for physical assistance;HOB elevated   General bed mobility comments: Cues  for technique. Use of rails. Assist with mobilizing BLEs, bottom and trunk to EOB. Increased time due to pain. Assist returning trunk and BLEs to bed. + nausea.  Transfers Overall transfer level: Needs assistance Equipment used: 2 person hand held assist Transfers: Sit to/from Stand Sit to Stand: Mod assist;+2 physical assistance;From elevated surface         General transfer comment: Mod A of 2 to rise from bed with cues for hand placement and anterior translation. Manual cues for upright posture and increased WB through BUEs. + nausea.  Ambulation/Gait Ambulation/Gait assistance:  (Not assessed secondary to pain, nausea.)              Stairs            Wheelchair Mobility    Modified Rankin (Stroke Patients Only)       Balance Overall balance assessment: Needs assistance Sitting-balance support: Feet supported;Bilateral upper extremity supported;Single extremity supported Sitting balance-Leahy Scale: Fair Sitting balance - Comments: Able to perform static sitting with UE support as position of comfort.   Standing balance support: During functional activity Standing balance-Leahy Scale: Poor                               Pertinent Vitals/Pain Pain Assessment: 0-10 Pain Score: 8  Pain Location: Left hip with movement Pain Descriptors / Indicators: Sharp;Sore Pain Intervention(s): Limited activity within patient's tolerance;Monitored during session;Premedicated before session;Repositioned    Home Living Family/patient expects to be discharged to:: Private residence Living Arrangements: Spouse/significant other Available Help at Discharge: Family;Available 24 hours/day Type of Home: House Home Access: Stairs to enter  Entrance Stairs-Rails: None Entrance Stairs-Number of Steps: 3 Home Layout: One level Home Equipment: None      Prior Function Level of Independence: Independent         Comments: 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     Hand Dominance   Dominant Hand: Right    Extremity/Trunk Assessment   Upper Extremity Assessment: Defer to OT evaluation           Lower Extremity Assessment: LLE deficits/detail;RLE deficits/detail;Generalized weakness RLE Deficits / Details: Grossly ~3/5 throughout with timid movement secondary to pain. LLE Deficits / Details: Limited AROM throughout secondary to pain. Able to extend LLE but not lift against gravity. Minimal hip flexion, knee flexion and ankle AROM.      Communication   Communication: No difficulties  Cognition Arousal/Alertness: Awake/alert Behavior During Therapy: WFL for tasks assessed/performed Overall Cognitive Status: Within Functional Limits for tasks assessed                      General Comments General comments (skin integrity, edema, etc.): Education provided on healing time, importance of mobility and purpose of PT/expected outcomes. BP sitting EOB 105/52, BP in standing 90/50, symptomatic.    Exercises        Assessment/Plan    PT Assessment Patient needs continued PT services  PT Diagnosis Difficulty walking;Acute pain;Generalized weakness   PT Problem List Pain;Decreased strength;Decreased range of motion;Decreased activity tolerance;Decreased balance;Decreased mobility;Decreased safety awareness  PT Treatment Interventions Balance training;Gait training;Patient/family education;Functional mobility training;Therapeutic activities;Therapeutic exercise;Stair training;DME instruction   PT Goals (Current goals can be found in the Care Plan section) Acute Rehab PT Goals Patient Stated Goal: to return home  PT Goal Formulation: With patient Time For Goal Achievement: 08/28/14 Potential to Achieve Goals: Fair    Frequency Min 4X/week   Barriers to discharge        Co-evaluation               End of Session Equipment Utilized During Treatment: Gait belt Activity Tolerance: Patient limited by pain;Other  (comment) (nausea.) Patient left: in bed;with call bell/phone within reach;with bed alarm set;with family/visitor present Nurse Communication: Mobility status;Weight bearing status         Time: 5409-8119 PT Time Calculation (min) (ACUTE ONLY): 52 min   Charges:   PT Evaluation $Initial PT Evaluation Tier I: 1 Procedure PT Treatments $Therapeutic Activity: 8-22 mins   PT G CodesAlvie Cortez A 2014-08-15, 4:46 PM Carla Cortez, PT, DPT [phone]

## 2014-08-15 LAB — CBC
HCT: 25.3 % — ABNORMAL LOW (ref 36.0–46.0)
Hemoglobin: 7.5 g/dL — ABNORMAL LOW (ref 12.0–15.0)
MCH: 21.1 pg — AB (ref 26.0–34.0)
MCHC: 29.6 g/dL — ABNORMAL LOW (ref 30.0–36.0)
MCV: 71.1 fL — AB (ref 78.0–100.0)
Platelets: 278 10*3/uL (ref 150–400)
RBC: 3.56 MIL/uL — ABNORMAL LOW (ref 3.87–5.11)
RDW: 20.8 % — ABNORMAL HIGH (ref 11.5–15.5)
WBC: 8 10*3/uL (ref 4.0–10.5)

## 2014-08-15 MED ORDER — MUPIROCIN 2 % EX OINT
1.0000 "application " | TOPICAL_OINTMENT | Freq: Two times a day (BID) | CUTANEOUS | Status: DC
  Administered 2014-08-15 – 2014-08-18 (×6): 1 via NASAL
  Filled 2014-08-15: qty 22

## 2014-08-15 MED ORDER — CIPROFLOXACIN HCL 250 MG PO TABS
250.0000 mg | ORAL_TABLET | Freq: Two times a day (BID) | ORAL | Status: DC
Start: 2014-08-15 — End: 2014-08-18
  Administered 2014-08-15 – 2014-08-18 (×7): 250 mg via ORAL
  Filled 2014-08-15 (×9): qty 1

## 2014-08-15 MED ORDER — CHLORHEXIDINE GLUCONATE CLOTH 2 % EX PADS
6.0000 | MEDICATED_PAD | Freq: Every day | CUTANEOUS | Status: DC
  Administered 2014-08-17 – 2014-08-18 (×2): 6 via TOPICAL

## 2014-08-15 NOTE — Progress Notes (Addendum)
Physical Therapy Treatment Patient Details Name: Carla Cortez MRN: [id number] DOB: 02-23-1956 Today's Date: 08/15/2014    History of Present Illness Patient is a 59 y/o female w/ PMH significant for anemia and celiac disease who was admitted yesterday for treatment of multiple pelvic fractures. Pt was trapped between a palette jack and the wall of a moving truck. CT abd/pelvis-increase in the extraperitoneal fluid in the pelvis with the presence of gas bubbles near the bladder suggesting a possible bladder laceration with leak. Hematuria resolved. Pelvis XRay-left sacral fracture, left iliac fracture extending to the left SI joint, and bil sup/inf pubic ramus fx. Pt was seen for 1131 to 1151 additionally.    PT Comments    Pt was seen for visit to attempt standing again and finally got CNA in to help get pt to chair, with mod max of 2 to attempt with walker.  Pt operates very poorly following instructions and does not push up off bed well, but did finally get LLE under her to attempt slide.  Finally got CNA and nursing to move bed to pull chair under her.  Follow Up Recommendations  SNF;Supervision/Assistance - 24 hour     Equipment Recommendations  Rolling walker with 5" wheels    Recommendations for Other Services       Precautions / Restrictions Precautions Precautions: Fall Restrictions Weight Bearing Restrictions: Yes RLE Weight Bearing: Weight bearing as tolerated LLE Weight Bearing: Weight bearing as tolerated    Mobility  Bed Mobility Overal bed mobility: Needs Assistance Bed Mobility:  (Sitting scooting forward and back)           General bed mobility comments: cues and assistance to scoot back and forward  Transfers Overall transfer level: Needs assistance Equipment used: Rolling walker (2 wheeled);2 person hand held assist Transfers: Sit to/from [employer] Transfers Sit to Stand: Max assist Stand pivot transfers: +2 physical assistance;Max assist        General transfer comment: mod 2 to stand and finally had to move bed to put chair under pt  Ambulation/Gait             General Gait Details: unable   Stairs            Wheelchair Mobility    Modified Rankin (Stroke Patients Only)       Balance Overall balance assessment: Needs assistance Sitting-balance support: Feet supported Sitting balance-Leahy Scale: Fair   Postural control: Other (comment);Right lateral lean (forward lean) Standing balance support: Bilateral upper extremity supported;During functional activity Standing balance-Leahy Scale: Poor Standing balance comment: struggling to move either leg                    Cognition Arousal/Alertness: Awake/alert Behavior During Therapy: WFL for tasks assessed/performed Overall Cognitive Status: Within Functional Limits for tasks assessed                      Exercises      General Comments General comments (skin integrity, edema, etc.): Pt is struggling mainly with pain in LLE and feels like she should still gohome.  Talked with her about the reality of having toclimb stairs to go home.      Pertinent Vitals/Pain Pain Assessment: 0-10 Pain Score: 6  Pain Location: L hip to do anything Pain Intervention(s): Limited activity within patient's tolerance;Monitored during session;Premedicated before session;Repositioned    Home Living  Prior Function            PT Goals (current goals can now be found in the care plan section) Acute Rehab PT Goals Patient Stated Goal: to return home  Progress towards PT goals: Not progressing toward goals - comment (pt still cannot take a step toward the chair without 2 max)    Frequency  Min 4X/week    PT Plan Discharge plan needs to be updated    Co-evaluation             End of Session   Activity Tolerance: Patient limited by pain;Other (comment) (weakness) Patient left: with call bell/phone within  reach;with nursing/sitter in room;in chair     Time: 1049-1110 PT Time Calculation (min) (ACUTE ONLY): 21 min  Charges:  $Therapeutic Activity: 38-52 mins                    G Codes:      Ivar DrapeStout, Chevis Weisensel E 08/15/2014, 12:21 PM  Samul Dadauth Dalisha Shively, PT MS Acute Rehab Dept. Number: [phone]

## 2014-08-15 NOTE — Progress Notes (Addendum)
Subjective: Patient sitting on side of bed. Made slow progress with physical therapy, but she still wants to go home with assistance. Foley catheter is intact. She does not want to go to a SNF.  Objective: Vital signs in last 24 hours: Temp:  [98.5 F (36.9 C)-100 F (37.8 C)] 99.6 F (37.6 C) (01/22 0537) Pulse Rate:  [83-102] 87 (01/22 0537) Resp:  [16-18] 16 (01/22 0537) BP: (97-135)/(51-69) 105/51 mmHg (01/22 0537) SpO2:  [91 %-96 %] 96 % (01/22 0537)  Intake/Output from previous day: 01/21 0701 - 01/22 0700 In: 2193.3 [P.O.:720; I.V.:1473.3] Out: 2250 [Urine:2250] Intake/Output this shift: Total I/O In: 280 [P.O.:280] Out: -    Recent Labs  08/14/14 0306 08/15/14 0504  HGB 8.2* 7.5*    Recent Labs  08/14/14 0306 08/15/14 0504  WBC 5.8 8.0  RBC 3.80* 3.56*  HCT 27.4* 25.3*  PLT 227 278    Recent Labs  08/14/14 0306  NA 136  K 4.7  CL 107  CO2 20  BUN 11  CREATININE 0.68  GLUCOSE 110*  CALCIUM 8.5   No results for input(s): LABPT, INR in the last 72 hours. Moves lower extremities without difficulty. No sensory deficits. No motor deficits. Patient appears relatively comfortable. Left sided hemipelvis tenderness.  Assessment/Plan: 1. Left sacral fracture, left inferior/superior pubic rami fractures, left iliac wing fracture. Plan: Up with physical therapy weightbearing as tolerated. Oral pain medications as needed. At this point we do not think this is a surgical problem. She should be ready for discharge home over the weekend. Follow-up with Dr. Luiz BlareGraves in the office in 3 weeks. Dr. Pincus Largehandler/Danielle PA-C on call over the weekend for the practice. She will need home health physical therapy.  Carla Cortez G 08/15/2014, 12:32 PM

## 2014-08-15 NOTE — Progress Notes (Signed)
Patient ID: Carla Cortez, female   DOB: [birth year], 59 y.o.   MRN: [id number]  Subjective: The patient reports she currently is having no suprapubic discomfort. She said throughout the night occasionally the catheter would not drain and she would have some slight increased discomfort. She reports however that there were no clots passing that would have caused occlusion of the catheter.  Objective: Vital signs in last 24 hours: Temp:  [98.5 F (36.9 C)-100 F (37.8 C)] 99.6 F (37.6 C) (01/22 0537) Pulse Rate:  [74-102] 87 (01/22 0537) Resp:  [13-18] 16 (01/22 0537) BP: (97-135)/(51-69) 105/51 mmHg (01/22 0537) SpO2:  [91 %-96 %] 96 % (01/22 0537) Weight:  [65.8 kg (145 lb 1 oz)] 65.8 kg (145 lb 1 oz) (01/21 1100)A  Intake/Output from previous day: 01/21 0701 - 01/22 0700 In: 2193.3 [P.O.:720; I.V.:1473.3] Out: 2250 [Urine:2250] Intake/Output this shift:    Past Medical History  Diagnosis Date  . Heart murmur   . Celiac disease     Physical Exam:  Lungs - Normal respiratory effort, chest expands symmetrically.  Abdomen - Soft, slightly tender in the suprapubic region more so on the right and left sides & non-distended.  Lab Results:  Recent Labs  08/14/14 0306 08/15/14 0504  WBC 5.8 8.0  HGB 8.2* 7.5*  HCT 27.4* 25.3*   BMET  Recent Labs  08/14/14 0306  NA 136  K 4.7  CL 107  CO2 20  GLUCOSE 110*  BUN 11  CREATININE 0.68  CALCIUM 8.5   No results for input(s): LABURIN in the last 72 hours. Results for orders placed or performed during the hospital encounter of 08/13/14  MRSA PCR Screening     Status: Abnormal   Collection Time: 08/13/14  1:08 PM  Result Value Ref Range Status   MRSA by PCR POSITIVE (A) NEGATIVE Final    Comment:        The GeneXpert MRSA Assay (FDA approved for NASAL specimens only), is one component of a comprehensive MRSA colonization surveillance program. It is not intended to diagnose MRSA infection nor to guide or monitor  treatment for MRSA infections. RESULT CALLED TO, READ BACK BY AND VERIFIED WITH: Maia Breslow RN 15:45 08/13/14 ([email or web address])     Studies/Results: Ct Abdomen Pelvis W Contrast  08/14/2014   CLINICAL DATA:  trapped between a pallet jack and the wall of a moving truck. She was transferred here from [facility] and complains of pelvic pain as a result of pelvic fractures. Patient had Hgb slightly above 7 yesterday and hematuria; she became a trauma transfer to [facility].No N/V/D. H/o celiac disease and anemia. C-section x1. Patient states hematuria has improved since yesterday, but stated there was concern for her bladder  EXAM: CT ABDOMEN AND PELVIS WITH CONTRAST  TECHNIQUE: Multidetector CT imaging of the abdomen and pelvis was performed using the standard protocol following bolus administration of intravenous contrast.  CONTRAST:  1mL OMNIPAQUE IOHEXOL 300 MG/ML  SOLN  COMPARISON:  CT 08/13/2014  FINDINGS: Dependent atelectasis/ consolidation in the visualized lung bases. No effusion. Unremarkable liver, gallbladder, spleen, adrenal glands, pancreas, right kidney. Subcentimeter probable cyst in the interpolar region left kidney. No hydronephrosis. Stomach, small bowel, and colon are nondilated. Uterus and adnexal regions unremarkable.  There is a moderate amount of extraperitoneal fluid in the pelvis bilaterally extending into the presacral space, increased since previous exam. There are new scattered gas bubbles anterior to the urinary bladder. The urinary bladder is incompletely distended and thick walled, partially decompressed by Foley  catheter. Fractures of the left sacral ala, left ilium, and bilateral superior and inferior ischial pubic rami. Hips are intact. Lumbar spine intact.  IMPRESSION: 1. Increase in extraperitoneal fluid in the pelvis which may represent progressive hemorrhage although presence of gas bubbles near the urinary bladder suggest possible bladder laceration in leak. Delayed images were not obtained to  assess for any contrast leakage outside of the urinary bladder. 2. Left sacral, left iliac, and bilateral ischiopubic rami fractures as previously described.   Electronically Signed   By: Oley Balmaniel  Hassell M.D.   On: 08/14/2014 13:15    Assessment: She appears to be doing well from urologic standpoint. Her hemoglobin is down slightly but she does not appear to have any significant active pelvic bleeding by exam and her vital signs remained stable. She maintains a good urine output. I am unclear as to why she is occasionally having some intermittent catheter occlusion but have recommended she continue to monitor this herself and alert the nursing staff should this continue to occur. I have placed her on prophylactic antibiotic therapy. Her catheter will remain in place.  Plan: 1. Cipro 250 mg BID while foley indwelling. 2. Monitor for catheter occlusion. 3. Maintain Foley catheter.  Garnett FarmTTELIN,Tyresse Jayson C 08/15/2014, 7:34 AM

## 2014-08-15 NOTE — Progress Notes (Signed)
LCSW review of chart as well as therapies and following for disposition.  Patient is requesting to go home at DC, with help from boyfriend and not wanting SNF at DC. Home Health being recommended.  At this time, no other needs voiced by patient for SW.  If needs arise, please do not hesitate to consult and we will assist with disposition and other needs.  Deretha EmoryHannah Jheri Mitter LCSW, MSW Clinical Social Work: Emergency Room [phone]

## 2014-08-15 NOTE — Progress Notes (Signed)
Patient ID: Carla Cortez, female   DOB: 11-06-1955, 59 y.o.   MRN: [id number]   LOS: 2 days   Subjective: Doing pretty well   Objective: Vital signs in last 24 hours: Temp:  [98.5 F (36.9 C)-100 F (37.8 C)] 99.6 F (37.6 C) (01/22 0537) Pulse Rate:  [74-102] 87 (01/22 0537) Resp:  [13-18] 16 (01/22 0537) BP: (97-135)/(51-69) 105/51 mmHg (01/22 0537) SpO2:  [91 %-96 %] 96 % (01/22 0537) Weight:  [145 lb 1 oz (65.8 kg)] 145 lb 1 oz (65.8 kg) (01/21 1100) Last BM Date: 08/13/14   Laboratory  CBC  Recent Labs  08/14/14 0306 08/15/14 0504  WBC 5.8 8.0  HGB 8.2* 7.5*  HCT 27.4* 25.3*  PLT 227 278    Physical Exam General appearance: alert and no distress Resp: clear to auscultation bilaterally Cardio: regular rate and rhythm GI: Soft, +BS, mild TTP   Assessment/Plan: Blunt pelvic trauma  Multiple pelvic fxs -- WBAT per Dr. Luiz BlareGraves, PT/OT. Bladder injury -- Appreciate urology consult, continue foley Iron deficiency anemia -- Down today, continue to monitor Celiac disease FEN -- No issues VTE -- SCD's, Lovenox Dispo -- Home when moving better with PT/OT, likely over weekend    Freeman CaldronMichael J. Dewana Ammirati, PA-C Pager: [phone] General Trauma PA Pager: [phone]  08/15/2014

## 2014-08-16 LAB — CBC
HEMATOCRIT: 24.9 % — AB (ref 36.0–46.0)
Hemoglobin: 7.4 g/dL — ABNORMAL LOW (ref 12.0–15.0)
MCH: 21.4 pg — AB (ref 26.0–34.0)
MCHC: 29.7 g/dL — ABNORMAL LOW (ref 30.0–36.0)
MCV: 72.2 fL — ABNORMAL LOW (ref 78.0–100.0)
Platelets: 259 10*3/uL (ref 150–400)
RBC: 3.45 MIL/uL — ABNORMAL LOW (ref 3.87–5.11)
RDW: 21.1 % — ABNORMAL HIGH (ref 11.5–15.5)
WBC: 9.2 10*3/uL (ref 4.0–10.5)

## 2014-08-16 NOTE — Progress Notes (Signed)
Physical Therapy Treatment Patient Details Name: Carla Cortez MRN: [id number] DOB: 11-15-1955 Today's Date: 08/16/2014    History of Present Illness Patient is a 59 y/o female w/ PMH significant for anemia and celiac disease who was admitted yesterday for treatment of multiple pelvic fractures. Pt was trapped between a palette jack and the wall of a moving truck. Pelvis XRay-left sacral fracture, left iliac fracture extending to the left SI joint, and bil sup/inf pubic ramus fx.    PT Comments    Patient with limited mobility progression secondary to pain, especially Left hip in standing.  Patient appears nervous to perform standing tasks. Therefore update d/c plan for HHPT with w/c for basic household mobility. Patient has some physical support short term.  Feel that patient will progress with mobility quickly once pain controlled.  Patient may benefit from further skilled PT on this level of care to progress mobility and enhance safety.  Follow Up Recommendations  Home health PT     Equipment Recommendations  Rolling walker with 5" wheels;3in1 (PT);Wheelchair (measurements PT)    Recommendations for Other Services       Precautions / Restrictions Precautions Precautions: Fall Restrictions Weight Bearing Restrictions: Yes RLE Weight Bearing: Weight bearing as tolerated LLE Weight Bearing: Weight bearing as tolerated    Mobility  Bed Mobility               General bed mobility comments: sitting EOB upon arrival, wished to stay to EOB to eat dinner after tx  Transfers Overall transfer level: Needs assistance Equipment used: Rolling walker (2 wheeled) Transfers: Sit to/from Stand Sit to Stand: Min assist         General transfer comment: min cues for hand placement during transfers  Ambulation/Gait Ambulation/Gait assistance: Min assist Ambulation Distance (Feet): 3 Feet Assistive device: Rolling walker (2 wheeled) Gait Pattern/deviations: Shuffle;Decreased weight  shift to left;Decreased stance time - left     General Gait Details: practiced lateral weight shifts in standing with RW min assist   Stairs            Wheelchair Mobility    Modified Rankin (Stroke Patients Only)       Balance Overall balance assessment: Needs assistance Sitting-balance support: No upper extremity supported Sitting balance-Leahy Scale: Good     Standing balance support: Bilateral upper extremity supported Standing balance-Leahy Scale: Poor Standing balance comment: reliance on RW for pain management                    Cognition Arousal/Alertness: Awake/alert Behavior During Therapy: WFL for tasks assessed/performed Overall Cognitive Status: Within Functional Limits for tasks assessed                      Exercises      General Comments        Pertinent Vitals/Pain Pain Assessment: 0-10 Pain Score: 4  Pain Location: left posterior hip in weight bearing Pain Descriptors / Indicators: Discomfort;Sore Pain Intervention(s): Limited activity within patient's tolerance;Monitored during session    Home Living                      Prior Function            PT Goals (current goals can now be found in the care plan section) Acute Rehab PT Goals Patient Stated Goal: to return home  PT Goal Formulation: With patient Time For Goal Achievement: 08/28/14 Potential to Achieve Goals: Good Progress towards PT  goals: Not progressing toward goals - comment    Frequency  Min 4X/week    PT Plan Discharge plan needs to be updated    Co-evaluation             End of Session   Activity Tolerance: Patient limited by pain Patient left: in bed;with call bell/phone within reach;with family/visitor present     Time: 8295-6213 PT Time Calculation (min) (ACUTE ONLY): 28 min  Charges:  $Therapeutic Activity: 23-37 mins                    G CodesNestor Lewandowsky, [place] [phone] Carla Cortez 08/16/2014, 5:30 PM

## 2014-08-16 NOTE — Progress Notes (Signed)
Occupational Therapy Treatment Patient Details Name: Carla Cortez MRN: [id number] DOB: [birth year] Today's Date: 08/16/2014    History of present illness Patient is a 59 y/o female w/ PMH significant for anemia and celiac disease who was admitted yesterday for treatment of multiple pelvic fractures. Pt was trapped between a palette jack and the wall of a moving truck. CT abd/pelvis-increase in the extraperitoneal fluid in the pelvis with the presence of gas bubbles near the bladder suggesting a possible bladder laceration with leak. Hematuria resolved. Pelvis XRay-left sacral fracture, left iliac fracture extending to the left SI joint, and bil sup/inf pubic ramus fx.   OT comments  Pt. And boyfriend eager for participation today and much improved from previous documentation. Focus of session was safe sit/stand and stand pivot transfers.  Pt. Able to complete with min a recliner to bsc with education to boyfriend of hand placement and transfer technique.  Plan looks like d/c home tomorrow after one more PT/OT session in a.m.   Follow Up Recommendations  Home health OT;Supervision/Assistance - 24 hour    Equipment Recommendations  3 in 1 bedside comode    Recommendations for Other Services      Precautions / Restrictions Precautions Precautions: Fall Restrictions RLE Weight Bearing: Weight bearing as tolerated LLE Weight Bearing: Weight bearing as tolerated       Mobility Bed Mobility               General bed mobility comments: in recliner upon arrival  Transfers Overall transfer level: Needs assistance Equipment used: Rolling walker (2 wheeled) Transfers: Sit to/from [employer] Sit to Stand: Min assist Stand pivot transfers: Min assist       General transfer comment: min a with pts. boyfriend standing in front for pt. confidence, reviewed during actual transfer he needed to be where i was standing behind and to the side.  verbalized understanding but  would benefit from continued practice with him in the back supporting hips.  pt. did great with cues to push through b ues from arm rests and into walker  controlled desend to bsc and chair    Balance                                   ADL Overall ADL's : Needs assistance/impaired                         Toilet Transfer: With caregiver independent assisting;Cueing for safety;Cueing for sequencing;Stand-pivot;BSC;Minimal assistance Toilet Transfer Details (indicate cue type and reason): pts. boyfriend present and is very active in pt. care.  did well following inst. for safe pivot transfer Toileting- Clothing Manipulation and Hygiene: Minimal assistance;Sit to/from stand         General ADL Comments: pt. states she is feeling much better today.  able to transfer from recliner to bsc min a with care giver (boyfriend) able to assist.  states he got her to recliner prior to my arrival using what was described as a "slow dancing" position with her arms around his neck.  pt. and boyfriend educated on high safety and fall risk with this method.  reviewed safe transfer tech. and position he needed to be in during transfer.  able to return demo      Vision                     Perception  Praxis      Cognition   Behavior During Therapy: WFL for tasks assessed/performed Overall Cognitive Status: Within Functional Limits for tasks assessed                       Extremity/Trunk Assessment               Exercises     Shoulder Instructions       General Comments      Pertinent Vitals/ Pain       Pain Assessment: No/denies pain (states she is avoiding meds so she wont feel "loopy")  Home Living                                          Prior Functioning/Environment              Frequency Min 2X/week     Progress Toward Goals  OT Goals(current goals can now be found in the care plan section)  Progress  towards OT goals: Progressing toward goals     Plan Discharge plan remains appropriate    Co-evaluation                 End of Session Equipment Utilized During Treatment: Gait belt;Rolling walker   Activity Tolerance Patient tolerated treatment well   Patient Left in chair;with call bell/phone within reach;with family/visitor present   Nurse Communication          Time: 1005-1035 OT Time Calculation (min): 30 min  Charges: OT General Charges $OT Visit: 1 Procedure OT Treatments $Self Care/Home Management : 23-37 mins  Robet LeuMorris, Markice Torbert Lorraine, COTA/L 08/16/2014, 10:51 AM

## 2014-08-16 NOTE — Progress Notes (Signed)
PT Cancellation Note  Patient Details Name: Carla Cortez MRN: [id number] DOB: 11-15-1955   Cancelled Treatment:     Attempted to see patient before lunch. She declined secondary to "just returned to bed."  Attempted second time. She requested I return in an hour as she currently has a HA and would like her husband to be present (he stepped away for lunch).  Patient declined need for medicines or to have nursing notified.  Will check back again this pm as able.  Stephanie AcreKristen M MartinsburgSoth, PT [phone] Elga Santy 08/16/2014, 2:48 PM

## 2014-08-16 NOTE — Progress Notes (Signed)
   PATIENT ID: Carla Cortez        Subjective: Sitting up in chair, minimal pain. Wants to get up with therapy. Wants to go home.   Objective:  Filed Vitals:   08/16/14 0540  BP: 100/59  Pulse: 97  Temp: 100.4 F (38 C)  Resp: 18     Spontaneously moves LE without difficulty or pain Calves soft, nontender Distally NVI  Labs:   Recent Labs  08/14/14 0306 08/15/14 0504 08/16/14 0420  HGB 8.2* 7.5* 7.4*   Recent Labs  08/15/14 0504 08/16/14 0420  WBC 8.0 9.2  RBC 3.56* 3.45*  HCT 25.3* 24.9*  PLT 278 259   Recent Labs  08/14/14 0306  NA 136  K 4.7  CL 107  CO2 20  BUN 11  CREATININE 0.68  GLUCOSE 110*  CALCIUM 8.5    Assessment and Plan: 1. Left sacral fracture, left inferior/superior pubic rami fractures, left iliac wing fracture. Treating nonop Plan: Up with physical therapy weightbearing as tolerated. Oral pain medications as needed. She should be ready for discharge home over the weekend per primary team Follow-up with Dr. Luiz BlareGraves in the office in 3 weeks.  VTE proph: per primary team, lovenox

## 2014-08-16 NOTE — Progress Notes (Signed)
Patient ID: Carla Cortez, female   DOB: [birth year], 59 y.o.   MRN: [id number]    Subjective: Pt still with questions about catheter drainage.  UOP has been adequate overnight.  Objective: Vital signs in last 24 hours: Temp:  [98 F (36.7 C)-100.4 F (38 C)] 100.4 F (38 C) (01/23 0540) Pulse Rate:  [81-97] 97 (01/23 0540) Resp:  [18] 18 (01/23 0540) BP: (100-114)/(52-62) 100/59 mmHg (01/23 0540) SpO2:  [90 %-93 %] 90 % (01/23 0540)  Intake/Output from previous day: 01/22 0701 - 01/23 0700 In: 1140 [P.O.:1140] Out: 950 [Urine:950] Intake/Output this shift:    Physical Exam:  General: Alert and oriented GU: Urethral catheter in place and draining grossly clear urine.  Lab Results:  Recent Labs  08/14/14 0306 08/15/14 0504 08/16/14 0420  HGB 8.2* 7.5* 7.4*  HCT 27.4* 25.3* 24.9*   BMET  Recent Labs  08/14/14 0306  NA 136  K 4.7  CL 107  CO2 20  GLUCOSE 110*  BUN 11  CREATININE 0.68  CALCIUM 8.5       Assessment/Plan: 1) Extraperitoneal bladder injury: Continue indwelling catheter for 2 weeks.  Pt to follow up with Dr. Vernie Ammonsttelin in 2 weeks with a cystogram and possible catheter removal. Continue antibiotic prophylaxis. Begin catheter teaching.  Please call if further questions.   LOS: 3 days   Myriah Boggus,LES 08/16/2014, 7:41 AM

## 2014-08-16 NOTE — Progress Notes (Signed)
Patient ID: Carla Cortez, female   DOB: October 16, 1955, 59 y.o.   MRN: [id number]   LOS: 3 days   Subjective: Doing better, still having problems with foley.   Objective: Vital signs in last 24 hours: Temp:  [98 F (36.7 C)-100.4 F (38 C)] 100.4 F (38 C) (01/23 0540) Pulse Rate:  [81-97] 97 (01/23 0540) Resp:  [18] 18 (01/23 0540) BP: (100-114)/(52-62) 100/59 mmHg (01/23 0540) SpO2:  [90 %-93 %] 90 % (01/23 0540) Last BM Date: 08/13/14   Laboratory  CBC  Recent Labs  08/15/14 0504 08/16/14 0420  WBC 8.0 9.2  HGB 7.5* 7.4*  HCT 25.3* 24.9*  PLT 278 259    Physical Exam General appearance: alert and no distress Resp: clear to auscultation bilaterally Cardio: regular rate and rhythm GI: normal findings: bowel sounds normal and soft, non-tender   Assessment/Plan: Blunt pelvic trauma  Multiple pelvic fxs -- WBAT per Dr. Luiz BlareGraves, PT/OT. Bladder injury -- Appreciate urology consult, continue foley Iron deficiency anemia -- Stable Celiac disease FEN -- No issues VTE -- SCD's, Lovenox Dispo -- Home when moving better with PT/OT, likely over weekend    Freeman CaldronMichael J. Exa Bomba, PA-C Pager: [phone] General Trauma PA Pager: [phone]  08/16/2014

## 2014-08-17 MED ORDER — HYOSCYAMINE SULFATE 0.125 MG SL SUBL: 0.1250 mg | SUBLINGUAL_TABLET | SUBLINGUAL | Status: DC | PRN

## 2014-08-17 MED ORDER — OXYCODONE-ACETAMINOPHEN 10-325 MG PO TABS: 1.0000 | ORAL_TABLET | ORAL | Status: DC | PRN

## 2014-08-17 MED ORDER — CIPROFLOXACIN HCL 250 MG PO TABS: 250.0000 mg | ORAL_TABLET | Freq: Two times a day (BID) | ORAL | Status: DC

## 2014-08-17 NOTE — Progress Notes (Signed)
Spoke with CM [employer]ie [employer](Sarah Jefferies) to order patient equipment for home, family updated.

## 2014-08-17 NOTE — Discharge Instructions (Signed)
No driving while taking oxycodone.  Call worker's compensation company tomorrow, ask to speak to your case manager, and tell her you have a prescription for home health physical and occupational therapies.  Direct FMLA paperwork to Dr. Luiz BlareGraves' office.

## 2014-08-17 NOTE — Progress Notes (Signed)
Occupational Therapy Treatment Patient Details Name: Carla Cortez MRN: [id number] DOB: 1955-09-03 Today's Date: 08/17/2014    History of present illness Patient is a 59 y/o female w/ PMH significant for anemia and celiac disease who was admitted yesterday for treatment of multiple pelvic fractures. Pt was trapped between a palette jack and the wall of a moving truck. Pelvis XRay-left sacral fracture, left iliac fracture extending to the left SI joint, and bil sup/inf pubic ramus fx.   OT comments  Pt seen today for ADL session and family education. Pt plans to d/c home today with assist from boyfriend. Pt and boyfriend educated in safe transfer technique, management of catheter during mobility, and safety with ADLs. Pt still needs BSC and PT is recommending w/c for community mobility.    Follow Up Recommendations  Home health OT;Supervision/Assistance - 24 hour    Equipment Recommendations  3 in 1 bedside comode    Recommendations for Other Services      Precautions / Restrictions Precautions Precautions: Fall Restrictions Weight Bearing Restrictions: Yes RLE Weight Bearing: Weight bearing as tolerated LLE Weight Bearing: Weight bearing as tolerated       Mobility Bed Mobility               General bed mobility comments: Pt sitting in recliner when OT arrived.   Transfers Overall transfer level: Needs assistance Equipment used: Rolling walker (2 wheeled) Transfers: Sit to/from Stand Sit to Stand: Min guard         General transfer comment: Min guard for safety. No physical assistance needed. Good hand placement.     Balance Overall balance assessment: Needs assistance Sitting-balance support: No upper extremity supported;Feet supported Sitting balance-Leahy Scale: Good     Standing balance support: Bilateral upper extremity supported;During functional activity Standing balance-Leahy Scale: Fair Standing balance comment: Pt able to remove single UE from RW  without LOB, however requires Bil UE support on RW for dynamic balance.                    ADL Overall ADL's : Needs assistance/impaired     Grooming: Supervision/safety;Standing Grooming Details (indicate cue type and reason): with 1 UE support Upper Body Bathing: Set up;Sitting   Lower Body Bathing: Moderate assistance;Sit to/from stand   Upper Body Dressing : Set up;Sitting   Lower Body Dressing: Maximal assistance;Sit to/from stand   Toilet Transfer: Min guard;Ambulation;RW;With caregiver independent Licensed conveyancer Details (indicate cue type and reason): sit<>stand from recliner and steps around room. Pt's boyfriend demonstrated good assist.  Toileting- Architect and Hygiene: Min guard;Sit to/from stand Toileting - Clothing Manipulation Details (indicate cue type and reason): educated pt in use of baby wipes for increased ease with toilet hygiene. Pt will be d/c with foley catheter.    Tub/Shower Transfer Details (indicate cue type and reason): Educated pt on safe shower transfer. Advised pt to consider sponge bathing until foley is d/c and deferred to RN for further information regarding foley instructions.  Functional mobility during ADLs: Min guard;Rolling walker General ADL Comments: Pt is progressing quickly and able to stand with min guard provided from her boyfriend. Provided basic foley catheter education to pt/boyfriend regarding positioning of catheter bag during mobility, wearing house robe for increased ease of foley management, and keeping catheter bag below bladder level.                 Cognition  Arousal/alertness: Awake/Alert Behavior During Therapy: WFL for tasks assessed/performed Overall Cognitive Status: Within  Functional Limits for tasks assessed                                    Pertinent Vitals/ Pain       Pain Assessment: 0-10 Pain Score: 6  Pain Location: L posterior hip Pain Descriptors / Indicators:  Aching Pain Intervention(s): Limited activity within patient's tolerance;Monitored during session;Repositioned         Frequency Min 2X/week     Progress Toward Goals  OT Goals(current goals can now be found in the care plan section)  Progress towards OT goals: Progressing toward goals  Acute Rehab OT Goals Patient Stated Goal: to return home  OT Goal Formulation: With patient Time For Goal Achievement: 08/28/14  Plan Discharge plan remains appropriate       End of Session Equipment Utilized During Treatment: Gait belt;Rolling walker   Activity Tolerance Patient tolerated treatment well   Patient Left in chair;with call bell/phone within reach;with family/visitor present   Nurse Communication Other (comment) (pt needs BSC)        Time: 1010-1035 OT Time Calculation (min): 25 min  Charges: OT General Charges $OT Visit: 1 Procedure OT Treatments $Self Care/Home Management : 23-37 mins  Rae LipsMiller, Sharlon Pfohl M 08/17/2014, 10:49 AM  Carney LivingLeeAnn Marie Halana Deisher, OTR/L Occupational Therapist [phone] (pager)

## 2014-08-17 NOTE — Progress Notes (Signed)
Per CM, patient is WC, equipment and home health will not be set up until tomorrow d/t the weekend.  Family and CRN notified.

## 2014-08-17 NOTE — Discharge Summary (Signed)
Physician Discharge Summary  Patient ID: Carla Cortez MRN: [id number] DOB/AGE: [birth year] 59 y.o.  Admit date: 08/13/2014 Discharge date: 08/17/2014  Discharge Diagnoses Patient Active Problem List   Diagnosis Date Noted  . Blunt abdominal trauma 08/14/2014  . Bladder injury 08/14/2014  . Celiac disease 08/14/2014  . Iron deficiency anemia 08/14/2014  . Multiple pelvic fractures 08/13/2014    Consultants Dr. Jodi GeraldsJohn Graves for orthopedic surgery  Dr. Ihor GullyMark Ottelin for urology   Procedures None   HPI: Carla Cortez was working with a powered pallet jack and was having trouble unloading the pallet. She somehow ended up pinned between the jack and a semi. She did not fall or lose consciousness. She was helped to a chair after the incident but did not try to ambulate. [facility] where she was found to have multiple pelvic fxs and an anemia. She states that she regularly gets anemic secondary to her celiac disease. She generally has to get iron infusions and knew it was getting time to get one because she was starting to have some exertional dyspnea. [place] and had not yet established with a PCP here. [facility] and admitted by the trauma service. Orthopedic surgery was consulted.   Hospital Course: Orthopedic surgery recommended non-operative treatment of her pelvic fractures. She received a unit of packed red [facility] cells [facility] for her anemia and was given an iron dextran infusion here. She was noted to have hematuria from her foley catheter and a review [facility] showed a non-contrasted study. She had a CT scan of her abdomen and pelvis repeated, this time with contrast, and this showed a small bladder leak. Urology was consulted and recommended continuing with foley decompression and outpatient follow up. She was mobilized with physical and occupational therapies and progressed to the point where she was able to return  home with the assistance of her boyfriend. Her pain was controlled on oral medications and she was discharged home in improved condition.     Medication List    TAKE these medications        aspirin 81 MG tablet  Take 81 mg by mouth daily.     ciprofloxacin 250 MG tablet  Commonly known as:  CIPRO  Take 1 tablet (250 mg total) by mouth 2 (two) times daily.     hyoscyamine 0.125 MG SL tablet  Commonly known as:  LEVSIN SL  Place 1 tablet (0.125 mg total) under the tongue every 4 (four) hours as needed (Bladder spasms).     multivitamin with minerals tablet  Take 1 tablet by mouth daily.     oxyCODONE-acetaminophen 10-325 MG per tablet  Commonly known as:  PERCOCET  Take 1-2 tablets by mouth every 4 (four) hours as needed for pain.     STRESS FORMULA Tabs  Take 1 tablet by mouth daily.        Follow-up Information    Follow up with Garnett FarmTTELIN,MARK C, MD.   Specialty:  Urology   Why:  To schedule an appointment ~2 weeks from your discharge to get your catheter out.   Contact information:   [address] [place] [address] [phone]       Follow up with Harvie JuniorGRAVES,JOHN L, MD.   Specialty:  Orthopedic Surgery   Contact information:   [address] [place] [address] [phone]       Follow up with [facility].  Why:  As needed   Contact information:   [address] [place] [address] [phone]      Follow up with Primary care provider.       Signed: Freeman Caldron, PA-C Pager: [phone] General Trauma PA Pager: [phone] 08/17/2014, 10:29 AM

## 2014-08-17 NOTE — Progress Notes (Signed)
Patient ID: Carla Cortez, female   DOB: August 05, 1955, 59 y.o.   MRN: [id number]   LOS: 4 days   Subjective: Doing well, ready to go home.   Objective: Vital signs in last 24 hours: Temp:  [98.7 F (37.1 C)-99.7 F (37.6 C)] 99 F (37.2 C) (01/24 0509) Pulse Rate:  [91-96] 96 (01/24 0509) Resp:  [16] 16 (01/24 0509) BP: (95-112)/(52-58) 112/53 mmHg (01/24 0509) SpO2:  [92 %-93 %] 92 % (01/24 0509) Last BM Date: 08/13/14   Physical Exam General appearance: alert and no distress Resp: clear to auscultation bilaterally Cardio: regular rate and rhythm GI: normal findings: bowel sounds normal and soft, non-tender Extremities: NVI   Assessment/Plan: Blunt pelvic trauma  Multiple pelvic fxs -- WBAT per Dr. Luiz BlareGraves, PT/OT. Bladder injury -- Appreciate urology consult, continue foley Iron deficiency anemia -- Stable Celiac disease Dispo -- Ready to go home    Freeman CaldronMichael J. Pharaoh Pio, PA-C Pager: [phone] General Trauma PA Pager: [phone]  08/17/2014

## 2014-08-17 NOTE — Progress Notes (Signed)
Physical Therapy Treatment Patient Details Name: Carla Cortez MRN: [id number] DOB: 02/08/1956 Today's Date: 08/17/2014    History of Present Illness Patient is a 59 y/o female w/ PMH significant for anemia and celiac disease who was admitted yesterday for treatment of multiple pelvic fractures. Pt was trapped between a palette jack and the wall of a moving truck. Pelvis XRay-left sacral fracture, left iliac fracture extending to the left SI joint, and bil sup/inf pubic ramus fx.    PT Comments    Noting good progress with mobility, and amb in-room distance; Significant other providing good assist; we discussed car transfers, and ultimately pt and boyfriend seem confident about dc'ing home today; Recommend they have assist of two people to "bump' her up the steps to enter home backwards with wc.  OK for dc home from PT standpoint   Follow Up Recommendations  Home health PT     Equipment Recommendations  Rolling walker with 5" wheels;3in1 (PT);Wheelchair (measurements PT);Wheelchair cushion (measurements PT)    Recommendations for Other Services       Precautions / Restrictions Precautions Precautions: Fall Restrictions RLE Weight Bearing: Weight bearing as tolerated LLE Weight Bearing: Weight bearing as tolerated    Mobility  Bed Mobility               General bed mobility comments: In chair upon arrival; Reported the acit of getting up and OOB was much easier than yesterday  Transfers Overall transfer level: Needs assistance Equipment used: Rolling walker (2 wheeled) Transfers: Sit to/from Stand Sit to Stand: Min guard (without physical contact)         General transfer comment: min cues for hand placement during transfers; cues also for prepositioning LLE especially for comfort during transitions  Ambulation/Gait Ambulation/Gait assistance: Min guard Ambulation Distance (Feet): 15 Feet (x2) Assistive device: Rolling walker (2 wheeled) Gait Pattern/deviations:  Step-to pattern;Decreased stance time - left;Antalgic Gait velocity: very slow   General Gait Details: Much improved; more than tripled the distance walked yesterday; Slow-moving, but steady, and without loss of balance; noted LLE internally rotated; pt states that has been that way all her life   Stairs Stairs:  (Described technipue, "rolling" up steps in wc with +2 help)          Wheelchair Mobility    Modified Rankin (Stroke Patients Only)       Balance     Sitting balance-Leahy Scale: Good       Standing balance-Leahy Scale: Poor                      Cognition Arousal/Alertness: Awake/alert Behavior During Therapy: WFL for tasks assessed/performed Overall Cognitive Status: Within Functional Limits for tasks assessed                      Exercises      General Comments        Pertinent Vitals/Pain Pain Assessment: 0-10 Pain Score: 6  Pain Location: L post hip Pain Descriptors / Indicators: Aching Pain Intervention(s): Limited activity within patient's tolerance;Monitored during session;Premedicated before session    Home Living                      Prior Function            PT Goals (current goals can now be found in the care plan section) Acute Rehab PT Goals Patient Stated Goal: to return home  PT Goal Formulation: With patient Time  For Goal Achievement: 08/28/14 Potential to Achieve Goals: Good Progress towards PT goals: Progressing toward goals    Frequency  Min 4X/week    PT Plan Current plan remains appropriate    Co-evaluation             End of Session   Activity Tolerance: Patient tolerated treatment well Patient left: in chair;with call bell/phone within reach;with family/visitor present     Time: 1610-9604 PT Time Calculation (min) (ACUTE ONLY): 39 min  Charges:  $Gait Training: 23-37 mins $Therapeutic Activity: 8-22 mins                    G Codes:      Olen Pel 08/17/2014,  9:38 AM  Van Clines, PT  Acute Rehabilitation Services Pager [phone] Office [phone]

## 2014-08-18 NOTE — Progress Notes (Signed)
Subjective: The patient has begun to mobilize.  She's been to the bathroom several times.  She states overall she continues to improve on a daily basis.   Objective: Vital signs in last 24 hours: Temp:  [98.4 F (36.9 C)-98.9 F (37.2 C)] 98.6 F (37 C) (01/25 16100623) Pulse Rate:  [85-88] 88 (01/25 0623) Resp:  [18] 18 (01/25 0623) BP: (125-134)/(62-74) 134/62 mmHg (01/25 0623) SpO2:  [95 %] 95 % (01/25 0623)  Intake/Output from previous day: 01/24 0701 - 01/25 0700 In: 480 [P.O.:480] Out: 2000 [Urine:2000] Intake/Output this shift:     Recent Labs  08/16/14 0420  HGB 7.4*    Recent Labs  08/16/14 0420  WBC 9.2  RBC 3.45*  HCT 24.9*  PLT 259   No results for input(s): NA, K, CL, CO2, BUN, CREATININE, GLUCOSE, CALCIUM in the last 72 hours. No results for input(s): LABPT, INR in the last 72 hours.  Neurologically intact ABD soft Neurovascular intact Sensation intact distally Intact pulses distally No cellulitis present Compartment soft  Assessment/Plan: Status post anterior and posterior pelvic injury.  The patient has done recently well with mobilization.  At this point I think continued observation is appropriate.  I will see her back in the office in 2 weeks once discharged from the hospital.   Tonda Wiederhold L 08/18/2014, 8:31 AM

## 2014-08-18 NOTE — Progress Notes (Signed)
Physical Therapy Treatment Patient Details Name: Carla Cortez MRN: [id number] DOB: Aug 07, 1955 Today's Date: 08/18/2014    History of Present Illness Patient is a 59 y/o female w/ PMH significant for anemia and celiac disease who was admitted yesterday for treatment of multiple pelvic fractures. Pt was trapped between a palette jack and the wall of a moving truck. Pelvis XRay-left sacral fracture, left iliac fracture extending to the left SI joint, and bil sup/inf pubic ramus fx.    PT Comments    Pt with improved ambulation tolerance today and ability to participate in exercises. Pt overall mobility con't to be limited by bilat hip pain. Pt reports she will have a 2nd person meet her and her boyfriend at home today to assist up the 3 stairs in the wheel chair. Pt safe to d/c home once medically stable and pt received recommended DME.   Follow Up Recommendations  Home health PT     Equipment Recommendations  Rolling walker with 5" wheels;3in1 (PT);Wheelchair (measurements PT);Wheelchair cushion (measurements PT)    Recommendations for Other Services       Precautions / Restrictions Precautions Precautions: Fall Restrictions Weight Bearing Restrictions: Yes RLE Weight Bearing: Weight bearing as tolerated LLE Weight Bearing: Weight bearing as tolerated    Mobility  Bed Mobility               General bed mobility comments: pt sitting EOB upon PT arrival  Transfers Overall transfer level: Needs assistance Equipment used: Rolling walker (2 wheeled) Transfers: Sit to/from Stand Sit to Stand: Supervision         General transfer comment: increased time due to pain but safe technique  Ambulation/Gait Ambulation/Gait assistance: Min guard Ambulation Distance (Feet): 70 Feet Assistive device: Rolling walker (2 wheeled) Gait Pattern/deviations: Step-through pattern;Decreased stride length Gait velocity: very slow   General Gait Details: pt very guarded due to pain but  tolerated well with onlyi 1 standing rest stop   Stairs            Wheelchair Mobility    Modified Rankin (Stroke Patients Only)       Balance Overall balance assessment: No apparent balance deficits (not formally assessed)         Standing balance support: Bilateral upper extremity supported Standing balance-Leahy Scale: Poor Standing balance comment: pt requires RW for support                     Cognition Arousal/Alertness: Awake/alert Behavior During Therapy: WFL for tasks assessed/performed Overall Cognitive Status: Within Functional Limits for tasks assessed                      Exercises General Exercises - Lower Extremity Ankle Circles/Pumps: AROM;Both;10 reps Long Arc Quad: AROM;Both;10 reps Hip Flexion/Marching: AAROM;Both;10 reps    General Comments        Pertinent Vitals/Pain Pain Assessment: 0-10 Pain Score: 7  Pain Location: bilat hips L > R Pain Descriptors / Indicators: Aching Pain Intervention(s): RN gave pain meds during session    Home Living                      Prior Function            PT Goals (current goals can now be found in the care plan section) Progress towards PT goals: Progressing toward goals    Frequency  Min 4X/week    PT Plan Current plan remains appropriate    Co-evaluation  End of Session Equipment Utilized During Treatment: Gait belt Activity Tolerance: Patient tolerated treatment well Patient left:  (sitting EOB)     Time: 1610-96040929-1001 PT Time Calculation (min) (ACUTE ONLY): 32 min  Charges:  $Gait Training: 8-22 mins $Therapeutic Exercise: 8-22 mins                    G Codes:      Marcene BrawnChadwell, Carla Cortez 08/18/2014, 10:50 AM  Lewis ShockAshly Samamtha Cortez, PT, DPT Pager #: [phone] Office #: [phone]

## 2014-08-18 NOTE — Progress Notes (Signed)
Patient ID: Carla Cortez, female   DOB: 03-Jun-1956, 59 y.o.   MRN: [id number]   LOS: 5 days   Subjective: No changes, didn't leave yesterday because of issues with getting equipment on weekends.   Objective: Vital signs in last 24 hours: Temp:  [98.4 F (36.9 C)-98.9 F (37.2 C)] 98.6 F (37 C) (01/25 40980623) Pulse Rate:  [85-88] 88 (01/25 0623) Resp:  [18] 18 (01/25 0623) BP: (125-134)/(62-74) 134/62 mmHg (01/25 0623) SpO2:  [95 %] 95 % (01/25 0623) Last BM Date: 08/13/14   Physical Exam General appearance: alert and no distress Resp: clear to auscultation bilaterally Cardio: regular rate and rhythm GI: normal findings: bowel sounds normal and soft, non-tender   Assessment/Plan: Blunt pelvic trauma  Multiple pelvic fxs -- WBAT per Dr. Luiz BlareGraves, PT/OT. Bladder injury -- Appreciate urology consult, continue foley Iron deficiency anemia -- Stable Celiac disease Dispo -- Ready to go home    Freeman CaldronMichael J. Bernece Gall, PA-C Pager: [phone] General Trauma PA Pager: [phone]  08/18/2014

## 2014-08-18 NOTE — Progress Notes (Signed)
Pt planned for d/c home. Needs DME (rolling walker, wheelchair and BSC) and also HHPT/OT.  Pt reports no one from workers comp has contacted her.  She gave me her boss' number Kenard Gower(Drew [phone]) who gave me the HR person's number Revonda Standard(Allison [phone]) who gave me the company workers comp person's number Lavon Paganini(Mike Miller [phone] or [phone]  He gave me the Adjuster's name and number Carlean Purl(Morganna Stephens [phone] She asked for notes and orders to be faxed to her at [phone]) and said that the [facility] would be in touch with me.  Local WC CM is [employer]ryan Bernas.  Awaiting his call.  Chart notes and HH/DME orders are faxing at this time.  Carlyle LipaMichelle Tyrika Newman, RN BSN MHA CCM  Case Manager, Trauma Service/Unit 34M [phone]

## 2015-04-16 DIAGNOSIS — D539 Nutritional anemia, unspecified: Secondary | ICD-10-CM | POA: Insufficient documentation

## 2015-08-18 DIAGNOSIS — R7989 Other specified abnormal findings of blood chemistry: Secondary | ICD-10-CM | POA: Insufficient documentation

## 2015-08-18 DIAGNOSIS — E7800 Pure hypercholesterolemia, unspecified: Secondary | ICD-10-CM | POA: Insufficient documentation

## 2015-08-18 DIAGNOSIS — N3281 Overactive bladder: Secondary | ICD-10-CM | POA: Insufficient documentation

## 2015-08-18 DIAGNOSIS — M810 Age-related osteoporosis without current pathological fracture: Secondary | ICD-10-CM | POA: Insufficient documentation

## 2015-08-21 ENCOUNTER — Encounter: Payer: Self-pay | Admitting: *Deleted

## 2015-08-24 ENCOUNTER — Encounter: Payer: Self-pay | Admitting: Obstetrics and Gynecology

## 2015-08-24 ENCOUNTER — Ambulatory Visit (INDEPENDENT_AMBULATORY_CARE_PROVIDER_SITE_OTHER): Payer: BLUE CROSS/BLUE SHIELD | Admitting: Obstetrics and Gynecology

## 2015-08-24 VITALS — BP 120/72 | HR 64 | Resp 16 | Ht 62.0 in | Wt 121.3 lb

## 2015-08-24 DIAGNOSIS — N952 Postmenopausal atrophic vaginitis: Secondary | ICD-10-CM | POA: Diagnosis not present

## 2015-08-24 DIAGNOSIS — R350 Frequency of micturition: Secondary | ICD-10-CM | POA: Diagnosis not present

## 2015-08-24 LAB — MICROSCOPIC EXAMINATION
Bacteria, UA: NONE SEEN
EPITHELIAL CELLS (NON RENAL): NONE SEEN /HPF (ref 0–10)

## 2015-08-24 LAB — URINALYSIS, COMPLETE
Bilirubin, UA: NEGATIVE
Glucose, UA: NEGATIVE
KETONES UA: NEGATIVE
Leukocytes, UA: NEGATIVE
Nitrite, UA: NEGATIVE
Protein, UA: NEGATIVE
Urobilinogen, Ur: 0.2 mg/dL (ref 0.2–1.0)
pH, UA: 5 (ref 5.0–7.5)

## 2015-08-24 LAB — BLADDER SCAN AMB NON-IMAGING: Scan Result: 117
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
## 2015-08-24 NOTE — Patient Instructions (Signed)
Vaginal Estrogen Cream (Premarin) apply a blueberry sized amount to vaginal opening using finger tip nightly for 2 weeks then use 3 times weekly before bed.

## 2015-09-14 ENCOUNTER — Ambulatory Visit: Payer: BLUE CROSS/BLUE SHIELD

## 2015-09-15 ENCOUNTER — Ambulatory Visit: Payer: BLUE CROSS/BLUE SHIELD | Admitting: Urology

## 2015-09-22 ENCOUNTER — Ambulatory Visit (INDEPENDENT_AMBULATORY_CARE_PROVIDER_SITE_OTHER): Payer: BLUE CROSS/BLUE SHIELD | Admitting: Urology

## 2015-09-22 ENCOUNTER — Encounter: Payer: Self-pay | Admitting: Urology

## 2015-09-22 VITALS — BP 131/79 | HR 69 | Ht 62.0 in | Wt 119.2 lb

## 2015-09-22 DIAGNOSIS — N3281 Overactive bladder: Secondary | ICD-10-CM

## 2015-09-22 NOTE — Progress Notes (Signed)
09/22/2015 2:21 PM   Carla Cortez 05-20-1956 [id number]  Referring provider: Barbette Reichmann, MD [address] [facility] [place], [place] [address]  Chief Complaint  Patient presents with  . Follow-up    urodynamic    HPI: Carla Cortez her for followup for OAB. She underwent UDS which showed small capacity bladder and unstable low amplitude detrusor contractions.  She has tried mirabegron and vesicare with little response. She has frequency, urgency, and nocturia 3-4x.    PMH: Past Medical History  Diagnosis Date  . Heart murmur   . Celiac disease     Surgical History: Past Surgical History  Procedure Laterality Date  . Cesarean section      Home Medications:    Medication List       This list is accurate as of: 09/22/15  2:21 PM.  Always use your most recent med list.               aspirin 81 MG tablet  Take 81 mg by mouth daily.     ferrous sulfate 325 (65 FE) MG tablet  Take by mouth.     fluticasone 50 MCG/ACT nasal spray  Commonly known as:  FLONASE  Place into the nose. Reported on 09/22/2015     multivitamin with minerals tablet  Take 1 tablet by mouth daily.     simvastatin 20 MG tablet  Commonly known as:  ZOCOR  Take by mouth.        Allergies:  Allergies  Allergen Reactions  . Gluten Meal Nausea And Vomiting  . Wheat Bran Nausea And Vomiting    Family History: No family history on file.  Social History:  reports that she has never smoked. She does not have any smokeless tobacco history on file. She reports that she does not drink alcohol. Her drug history is not on file.  ROS:                                        Physical Exam: BP 131/79 mmHg  Pulse 69  Ht  (1.575 m)  Wt 54.069 kg (119 lb 3.2 oz)  BMI 21.80 kg/m2  LMP  (LMP Unknown)  Constitutional:  Alert and oriented, No acute distress. HEENT: [place] AT, moist mucus membranes.  Trachea midline, no masses. Cardiovascular: No  clubbing, cyanosis, or edema. Respiratory: Normal respiratory effort, no increased work of breathing. GI: Abdomen is soft, nontender, nondistended, no abdominal masses GU: No CVA tenderness.  Skin: No rashes, bruises or suspicious lesions. Lymph: No cervical or inguinal adenopathy. Neurologic: Grossly intact, no focal deficits, moving all 4 extremities. Psychiatric: Normal mood and affect.  Laboratory Data: Lab Results  Component Value Date   WBC 9.2 08/16/2014   HGB 7.4* 08/16/2014   HCT 24.9* 08/16/2014   MCV 72.2* 08/16/2014   PLT 259 08/16/2014    Lab Results  Component Value Date   CREATININE 0.68 08/14/2014    No results found for: PSA  No results found for: TESTOSTERONE  No results found for: HGBA1C  Urinalysis    Component Value Date/Time   GLUCOSEU Negative 08/24/2015 1100   BILIRUBINUR Negative 08/24/2015 1100   NITRITE Negative 08/24/2015 1100   LEUKOCYTESUR Negative 08/24/2015 1100    Pertinent Imaging:   Assessment & Plan:   1. OAB -toviaz  daily -RTC 1 month  There are no diagnoses linked to this  encounter.  No Follow-up on file.  Malen Gauze, MD  [facility] [address], [address] [place], [place] [address] [phone]

## 2015-09-23 DIAGNOSIS — N3281 Overactive bladder: Secondary | ICD-10-CM | POA: Insufficient documentation
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
## 2015-10-20 ENCOUNTER — Ambulatory Visit (INDEPENDENT_AMBULATORY_CARE_PROVIDER_SITE_OTHER): Payer: BLUE CROSS/BLUE SHIELD | Admitting: Urology

## 2015-10-20 ENCOUNTER — Encounter: Payer: Self-pay | Admitting: Urology

## 2015-10-20 VITALS — BP 114/69 | HR 71 | Ht 62.0 in | Wt 121.8 lb

## 2015-10-20 DIAGNOSIS — N952 Postmenopausal atrophic vaginitis: Secondary | ICD-10-CM | POA: Diagnosis not present

## 2015-10-20 DIAGNOSIS — Z87448 Personal history of other diseases of urinary system: Secondary | ICD-10-CM

## 2015-10-20 DIAGNOSIS — N3281 Overactive bladder: Secondary | ICD-10-CM | POA: Diagnosis not present

## 2015-10-20 LAB — BLADDER SCAN AMB NON-IMAGING: SCAN RESULT: 26

## 2015-10-20 MED ORDER — TROSPIUM CHLORIDE 20 MG PO TABS: 20.0000 mg | ORAL_TABLET | Freq: Two times a day (BID) | ORAL | Status: DC
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
## 2015-11-23 ENCOUNTER — Encounter: Payer: Self-pay | Admitting: Urology

## 2015-11-23 ENCOUNTER — Ambulatory Visit (INDEPENDENT_AMBULATORY_CARE_PROVIDER_SITE_OTHER): Payer: BLUE CROSS/BLUE SHIELD | Admitting: Urology

## 2015-11-23 VITALS — BP 115/67 | HR 71 | Ht 62.0 in | Wt 123.1 lb

## 2015-11-23 DIAGNOSIS — N3281 Overactive bladder: Secondary | ICD-10-CM | POA: Diagnosis not present

## 2015-11-23 DIAGNOSIS — N952 Postmenopausal atrophic vaginitis: Secondary | ICD-10-CM | POA: Diagnosis not present

## 2015-11-23 DIAGNOSIS — Z87448 Personal history of other diseases of urinary system: Secondary | ICD-10-CM | POA: Diagnosis not present

## 2015-11-23 LAB — BLADDER SCAN AMB NON-IMAGING: Scan Result: 39

## 2015-11-23 MED ORDER — ESTROGENS, CONJUGATED 0.625 MG/GM VA CREA: 1.0000 | TOPICAL_CREAM | Freq: Every day | VAGINAL | Status: DC

## 2015-11-23 MED ORDER — ESTRADIOL 0.1 MG/GM VA CREA: TOPICAL_CREAM | VAGINAL | Status: DC

## 2015-11-23 NOTE — Progress Notes (Signed)
4:22 PM   Carla Cortez 09-23-1955 [id number]  Referring provider: Barbette Reichmann, MD [address] [facility] [place], [place] [address]  Chief Complaint  Patient presents with  . Over Active Bladder    one month follow up    HPI: Patient is a 60 year old Caucasian female who presents today after a trial of Sanctura for one month.  She states it did not provide any relief in her frequency, urgency and nocturia 3-4.   She has not continued her vaginal estrogen cream.    Background history Patient with a history of kidney stones, recurrent urinary tract infections and bladder injury approximately 1 year ago when she experienced significant trauma and multiple pelvic fractures. She has complaints of nocturia approximately 4 times per night.  She also experiences daytime frequency voiding approximately every 2 hours along with urgency without urge incontinence. She denies sensation of incomplete bladder emptying. She does experience leakage with coughing, sneezing and lifting. Does not require pads. Previous urology provider's Dr. Vernie Ammons at [facility] urology. She reports that she was prescribed Myrbetriq and Vesicare or Toviaz in the past. None of these medications improved her symptoms.  No vaginal dryness, irritation or burning. LMP few years ago. No HRT. She underwent UDS recently and it demonstrated a small capacity bladder and unstable low amplitude detrusor contractions.   She has not experienced any relief in her voiding symptoms with the Sanctura 20 mg twice daily.  She denies dysuria, gross hematuria or suprapubic pain.  She is not having flank pain, fevers, chills, nausea or vomiting.  She is still using the restroom every 45 minutes to an hour while at work.  She is still having nocturia x 4.  Her PVR is 39 mL today.   She does state that prior to voiding she has a onset of a hot flash, dry mouth and then she has the sudden urge to void.  This has been  occuring since the bladder injury.     PMH: Past Medical History  Diagnosis Date  . Heart murmur   . Celiac disease   . OAB (overactive bladder)   . Kidney stone     Surgical History: Past Surgical History  Procedure Laterality Date  . Cesarean section      Home Medications:    Medication List       This list is accurate as of: 11/23/15  4:22 PM.  Always use your most recent med list.               alendronate 70 MG tablet  Commonly known as:  FOSAMAX  Take by mouth.     aspirin 81 MG tablet  Take 81 mg by mouth daily.     ferrous sulfate 325 (65 FE) MG tablet  Take by mouth.     fesoterodine 8 MG Tb24 tablet  Commonly known as:  TOVIAZ  Take by mouth. Reported on 11/23/2015     fluticasone 50 MCG/ACT nasal spray  Commonly known as:  FLONASE  Place into the nose. Reported on 11/23/2015     multivitamin with minerals tablet  Take 1 tablet by mouth daily.     simvastatin 20 MG tablet  Commonly known as:  ZOCOR  Take by mouth.     trospium 20 MG tablet  Commonly known as:  SANCTURA  Take 1 tablet (20 mg total) by mouth 2 (two) times daily.        Allergies:  Allergies  Allergen Reactions  .  Gluten Meal Nausea And Vomiting  . Wheat Bran Nausea And Vomiting    Family History: Family History  Problem Relation Age of Onset  . Kidney disease Neg Hx   . Bladder Cancer Neg Hx     Social History:  reports that she has never smoked. She does not have any smokeless tobacco history on file. She reports that she does not drink alcohol or use illicit drugs.  ROS: UROLOGY Frequent Urination?: Yes Hard to postpone urination?: No Burning/pain with urination?: No Get up at night to urinate?: Yes Leakage of urine?: Yes Urine stream starts and stops?: No Trouble starting stream?: No Do you have to strain to urinate?: No Blood in urine?: No Urinary tract infection?: No Sexually transmitted disease?: No Injury to kidneys or bladder?: No Painful  intercourse?: No Weak stream?: No Currently pregnant?: No Vaginal bleeding?: No Last menstrual period?: n  Gastrointestinal Nausea?: No Vomiting?: No Indigestion/heartburn?: No Diarrhea?: No Constipation?: No  Constitutional Fever: No Night sweats?: No Weight loss?: No Fatigue?: No  Skin Skin rash/lesions?: No Itching?: No  Eyes Blurred vision?: No Double vision?: No  Ears/Nose/Throat Sore throat?: No Sinus problems?: No  Hematologic/Lymphatic Swollen glands?: No Easy bruising?: No  Cardiovascular Leg swelling?: No Chest pain?: No  Respiratory Cough?: No Shortness of breath?: No  Endocrine Excessive thirst?: No  Musculoskeletal Back pain?: No Joint pain?: No  Neurological Headaches?: No Dizziness?: No  Psychologic Depression?: No Anxiety?: No  Physical Exam: BP 115/67 mmHg  Pulse 71  Ht 5\' 2"  (1.575 m)  Wt 123 lb 1.6 oz (55.838 kg)  BMI 22.51 kg/m2  LMP  (LMP Unknown)  Constitutional: Well nourished. Alert and oriented, No acute distress. HEENT: [place] AT, moist mucus membranes. Trachea midline, no masses. Cardiovascular: No clubbing, cyanosis, or edema. Respiratory: Normal respiratory effort, no increased work of breathing. Skin: No rashes, bruises or suspicious lesions. Lymph: No cervical or inguinal adenopathy. Neurologic: Grossly intact, no focal deficits, moving all 4 extremities. Psychiatric: Normal mood and affect.  Laboratory Data: Lab Results  Component Value Date   WBC 9.2 08/16/2014   HGB 7.4* 08/16/2014   HCT 24.9* 08/16/2014   MCV 72.2* 08/16/2014   PLT 259 08/16/2014   Lab Results  Component Value Date   CREATININE 0.68 08/14/2014    Pertinent Imaging: Results for Lura EmEARCE, [place] (MRN [id number]) as of 11/23/2015 16:35  Ref. Range 11/23/2015 16:13  Scan Result Unknown 39    Assessment & Plan:   1. Overactive bladder:   Patient has overactive bladder as UDS has demonstrated.  She has not had success controlling her  symptoms with Myrbetriq, Vesicare, Toviaz and now, ReunionSanctura.  We did discuss PTNS therapy and she was agreeable to this form of treatment.  Unfortunately, she has a high deductible and cannot afford the treatment at this time.  I asked if she would like to see Dr. Sherron MondayMacDiarmid for further evaluation, but she declined.   I would like to like to have the patient to have a cystoscopy to rule out CIS.    - BLADDER SCAN AMB NON-IMAGING  2. Vaginal atrophy:   Patient is not using her vaginal estrogen cream.  I have given her samples and prescriptions and encouraged her to apply three nights weekly.    3. History of extraperitoneal bladder rupture:   Bladder injury after she was trapped between a palette jack and the wall of a moving truck in 07/2014.     Return for cystoscopy to CIS.  These notes generated  with voice recognition software. I apologize for typographical errors.  Zara Council, [facility] [address] [place], [place] [address] [phone]

## 2015-12-15 ENCOUNTER — Other Ambulatory Visit: Payer: BLUE CROSS/BLUE SHIELD | Admitting: Urology

## 2016-01-06 ENCOUNTER — Ambulatory Visit: Payer: Self-pay | Admitting: Oncology

## 2016-01-20 ENCOUNTER — Encounter: Payer: Self-pay | Admitting: Oncology

## 2016-01-20 ENCOUNTER — Inpatient Hospital Stay: Payer: BLUE CROSS/BLUE SHIELD | Attending: Oncology | Admitting: Oncology

## 2016-01-20 VITALS — BP 129/77 | HR 67 | Temp 98.3°F | Resp 18 | Wt 122.4 lb

## 2016-01-20 DIAGNOSIS — Z79899 Other long term (current) drug therapy: Secondary | ICD-10-CM | POA: Diagnosis not present

## 2016-01-20 DIAGNOSIS — R011 Cardiac murmur, unspecified: Secondary | ICD-10-CM | POA: Diagnosis not present

## 2016-01-20 DIAGNOSIS — K900 Celiac disease: Secondary | ICD-10-CM | POA: Diagnosis not present

## 2016-01-20 DIAGNOSIS — R5383 Other fatigue: Secondary | ICD-10-CM

## 2016-01-20 DIAGNOSIS — M818 Other osteoporosis without current pathological fracture: Secondary | ICD-10-CM | POA: Diagnosis not present

## 2016-01-20 DIAGNOSIS — Z87442 Personal history of urinary calculi: Secondary | ICD-10-CM

## 2016-01-20 DIAGNOSIS — D509 Iron deficiency anemia, unspecified: Secondary | ICD-10-CM | POA: Insufficient documentation

## 2016-01-20 DIAGNOSIS — M1990 Unspecified osteoarthritis, unspecified site: Secondary | ICD-10-CM | POA: Insufficient documentation

## 2016-01-20 DIAGNOSIS — R531 Weakness: Secondary | ICD-10-CM | POA: Diagnosis not present

## 2016-01-20 DIAGNOSIS — N3281 Overactive bladder: Secondary | ICD-10-CM | POA: Diagnosis not present

## 2016-01-20 NOTE — Progress Notes (Signed)
States is feeling fatigued and weak. Moved to [place] from [place] 2 years ago and needs to transfer care for management of celiac disease and IDA. States that in the past has needed iron treatments maybe once a year due to celiac disease.

## 2016-01-25 NOTE — Progress Notes (Signed)
[facility]  Telephone:(336) [phone] [phone]  ID: Carla Cortez OB: [birth year]  MR#: [id number]  AOZ#:[id number]#:[id number]  Patient Care Team: Barbette ReichmannVishwanath Hande, MD as PCP - General (Internal Medicine)  CHIEF COMPLAINT:  Chief Complaint  Patient presents with  . IDA    INTERVAL HISTORY: Patient is a 60 year old female who moved to [place] from [place] approximately 2 years ago. 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 Patient states she received periodic iron infusions approximately 1 or 2 times per year prior to moving. She is referred to clinic to establish care and consideration of IV iron. She currently feels weak and fatigued, but otherwise feels well. She has no neurologic complaints. She denies any recent fevers or illnesses. She has good appetite and denies weight loss. She has no chest pain or shortness of breath. She denies any nausea, vomiting, constipation, or diarrhea. She has no urinary complaints. Patient offers no further specific complaints.  REVIEW OF SYSTEMS:   Review of Systems  Constitutional: Positive for malaise/fatigue. Negative for fever and weight loss.  Respiratory: Negative.  Negative for shortness of breath.   Cardiovascular: Negative.  Negative for chest pain.  Gastrointestinal: Negative for abdominal pain, diarrhea, blood in stool and melena.  Genitourinary: Negative.  Negative for hematuria.  Musculoskeletal: Negative.   Neurological: Positive for weakness.  Psychiatric/Behavioral: Negative.     As per HPI. Otherwise, a complete review of systems is negatve.  PAST MEDICAL HISTORY: Past Medical History  Diagnosis Date  . Heart murmur   . Celiac disease   . OAB (overactive bladder)   . Kidney stone   . IDA (iron deficiency anemia)   . Arthritis   . Osteoporosis   . Postmenopausal     PAST SURGICAL HISTORY: Past Surgical History  Procedure Laterality Date  . Cesarean section       FAMILY HISTORY Family History  Problem Relation Age of Onset  . Kidney disease Neg Hx   . Bladder Cancer Neg Hx        ADVANCED DIRECTIVES:    HEALTH MAINTENANCE: Social History  Substance Use Topics  . Smoking status: Never Smoker   . Smokeless tobacco: Not on file  . Alcohol Use: No     Colonoscopy:  PAP:  Bone density:  Lipid panel:  Allergies  Allergen Reactions  . Gluten Meal Nausea And Vomiting  . Wheat Bran Nausea And Vomiting    Current Outpatient Prescriptions  Medication Sig Dispense Refill  . alendronate (FOSAMAX) 70 MG tablet Take 70 mg by mouth once a week.     . calcium carbonate (CALCIUM 600) 600 MG TABS tablet Take 600 mg by mouth daily with breakfast.    . ferrous sulfate 325 (65 FE) MG tablet Take 325 mg by mouth daily with breakfast.     . folic acid (FOLVITE) 800 MCG tablet Take 800 mcg by mouth daily.    . Multiple Vitamins-Minerals (MULTIVITAMIN WITH MINERALS) tablet Take 1 tablet by mouth daily.    . simvastatin (ZOCOR) 20 MG tablet Take 20 mg by mouth daily at 6 PM.     . vitamin B-12 (CYANOCOBALAMIN) 500 MCG tablet Take 500 mcg by mouth daily.     No current facility-administered medications for this visit.    OBJECTIVE: Filed Vitals:   01/20/16 1549  BP: 129/77  Pulse: 67  Temp: 98.3 F (36.8 C)  Resp: 18     Body mass index is 22.37 kg/(m^2).  ECOG FS:1 - Symptomatic but completely ambulatory  General: Well-developed, well-nourished, no acute distress. Eyes: Pink conjunctiva, anicteric sclera. HEENT: Normocephalic, moist mucous membranes, clear oropharnyx. Lungs: Clear to auscultation bilaterally. Heart: Regular rate and rhythm. No rubs, murmurs, or gallops. Abdomen: Soft, nontender, nondistended. No organomegaly noted, normoactive bowel sounds. Musculoskeletal: No edema, cyanosis, or clubbing. Neuro: Alert, answering all questions appropriately. Cranial nerves grossly intact. Skin: No rashes or petechiae noted. Psych:  Normal affect. Lymphatics: No cervical, calvicular, axillary or inguinal LAD.   LAB RESULTS:  Lab Results  Component Value Date   NA 136 08/14/2014   K 4.7 08/14/2014   CL 107 08/14/2014   CO2 20 08/14/2014   GLUCOSE 110* 08/14/2014   BUN 11 08/14/2014   CREATININE 0.68 08/14/2014   CALCIUM 8.5 08/14/2014   GFRNONAA >90 08/14/2014   GFRAA >90 08/14/2014    Lab Results  Component Value Date   WBC 9.2 08/16/2014   NEUTROABS 18.2* 08/13/2014   HGB 7.4* 08/16/2014   HCT 24.9* 08/16/2014   MCV 72.2* 08/16/2014   PLT 259 08/16/2014     STUDIES: No results found.  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  PLAN:    1. Iron deficiency anemia secondary to celiac disease: Although patient's most recent hemoglobin was found to be normal at 12.2, her iron stores are significantly decreased. She is also symptomatic. Return to clinic in 2 and 3 weeks to receive 510 mg of IV Feraheme. Patient will then return to clinic in 6 months with repeat laboratory work and further evaluation. She has also been instructed to continue her oral iron supplementation as prescribed. 2. Celiac disease: Treatment per GI.  Patient expressed understanding and was in agreement with this plan. She also understands that She can call clinic at any time with any questions, concerns, or complaints.    Jeralyn Ruthsimothy J Finnegan, MD   01/25/2016 11:07 PM

## 2016-02-03 ENCOUNTER — Inpatient Hospital Stay: Payer: BLUE CROSS/BLUE SHIELD | Attending: Oncology

## 2016-02-03 VITALS — BP 106/67 | HR 58 | Temp 98.1°F | Resp 18

## 2016-02-03 DIAGNOSIS — D509 Iron deficiency anemia, unspecified: Secondary | ICD-10-CM | POA: Insufficient documentation

## 2016-02-03 DIAGNOSIS — Z79899 Other long term (current) drug therapy: Secondary | ICD-10-CM | POA: Insufficient documentation

## 2016-02-03 MED ORDER — SODIUM CHLORIDE 0.9 % IV SOLN
510.0000 mg | Freq: Once | INTRAVENOUS | Status: AC
  Administered 2016-02-03: 510 mg via INTRAVENOUS
  Filled 2016-02-03: qty 17

## 2016-02-03 MED ORDER — SODIUM CHLORIDE 0.9 % IV SOLN
INTRAVENOUS | Status: DC
  Administered 2016-02-03: 15:00:00 via INTRAVENOUS
  Filled 2016-02-03: qty 1000

## 2016-02-10 ENCOUNTER — Inpatient Hospital Stay: Payer: BLUE CROSS/BLUE SHIELD

## 2016-02-10 VITALS — BP 105/66 | HR 59 | Temp 97.4°F | Resp 16

## 2016-02-10 DIAGNOSIS — D509 Iron deficiency anemia, unspecified: Secondary | ICD-10-CM | POA: Diagnosis not present

## 2016-02-10 MED ORDER — SODIUM CHLORIDE 0.9 % IV SOLN
Freq: Once | INTRAVENOUS | Status: AC
  Administered 2016-02-10: 15:00:00 via INTRAVENOUS
  Filled 2016-02-10: qty 1000

## 2016-02-10 MED ORDER — SODIUM CHLORIDE 0.9 % IV SOLN
510.0000 mg | Freq: Once | INTRAVENOUS | Status: AC
  Administered 2016-02-10: 510 mg via INTRAVENOUS
  Filled 2016-02-10: qty 17

## 2016-07-21 ENCOUNTER — Inpatient Hospital Stay: Payer: BLUE CROSS/BLUE SHIELD

## 2016-07-21 ENCOUNTER — Inpatient Hospital Stay: Payer: BLUE CROSS/BLUE SHIELD | Admitting: Oncology

## 2016-08-05 ENCOUNTER — Other Ambulatory Visit: Payer: Self-pay | Admitting: Oncology

## 2016-08-07 ENCOUNTER — Other Ambulatory Visit: Payer: Self-pay | Admitting: Oncology

## 2016-08-07 NOTE — Progress Notes (Signed)
[facility]  Telephone:(336) [phone] Fax:(336) [phone]  ID: Carla Cortez OB: 18-Dec-1955  MR#: [id number]  QQV#:[id number]  Patient Care Team: Barbette Reichmann, MD as PCP - General (Internal Medicine)  CHIEF COMPLAINT:  Iron deficiency anemia secondary to celiac disease.  INTERVAL HISTORY: Patient returns to clinic today for repeat laboratory work and further evaluation. She currently feels well and is asymptomatic. She does not complain of weakness or fatigue. She has no neurologic complaints. She denies any recent fevers or illnesses. She has good appetite and denies weight loss. She has no chest pain or shortness of breath. She denies any nausea, vomiting, constipation, or diarrhea. She has no urinary complaints. Patient offers no specific complaints today.  REVIEW OF SYSTEMS:   Review of Systems  Constitutional: Negative for fever, malaise/fatigue and weight loss.  Respiratory: Negative.  Negative for shortness of breath.   Cardiovascular: Negative.  Negative for chest pain and leg swelling.  Gastrointestinal: Negative for abdominal pain, blood in stool, diarrhea and melena.  Genitourinary: Negative.  Negative for hematuria.  Musculoskeletal: Negative.   Neurological: Negative.  Negative for weakness.  Psychiatric/Behavioral: Negative.  The patient is not nervous/anxious.     As per HPI. Otherwise, a complete review of systems is negative.  PAST MEDICAL HISTORY: Past Medical History:  Diagnosis Date  . Arthritis   . Celiac disease   . Heart murmur   . IDA (iron deficiency anemia)   . Kidney stone   . OAB (overactive bladder)   . Osteoporosis   . Postmenopausal     PAST SURGICAL HISTORY: Past Surgical History:  Procedure Laterality Date  . CESAREAN SECTION      FAMILY HISTORY Family History  Problem Relation Age of Onset  . Kidney disease Neg Hx   . Bladder Cancer Neg Hx        ADVANCED DIRECTIVES:    HEALTH MAINTENANCE: Social History    Substance Use Topics  . Smoking status: Never Smoker  . Smokeless tobacco: Not on file  . Alcohol use No     Colonoscopy:  PAP:  Bone density:  Lipid panel:  Allergies  Allergen Reactions  . Gluten Meal Nausea And Vomiting  . Wheat Bran Nausea And Vomiting    Current Outpatient Prescriptions  Medication Sig Dispense Refill  . alendronate (FOSAMAX) 70 MG tablet Take 70 mg by mouth once a week.     . calcium carbonate (CALCIUM 600) 600 MG TABS tablet Take 600 mg by mouth daily with breakfast.    . ferrous sulfate 325 (65 FE) MG tablet Take 325 mg by mouth daily with breakfast.     . folic acid (FOLVITE) 800 MCG tablet Take 800 mcg by mouth daily.    . Multiple Vitamins-Minerals (MULTIVITAMIN WITH MINERALS) tablet Take 1 tablet by mouth daily.    . simvastatin (ZOCOR) 20 MG tablet Take 20 mg by mouth daily at 6 PM.     . vitamin B-12 (CYANOCOBALAMIN) 500 MCG tablet Take 500 mcg by mouth daily.     No current facility-administered medications for this visit.     OBJECTIVE: Vitals:   08/08/16 1359  BP: 113/75  Pulse: 66  Resp: 18  Temp: 97.2 F (36.2 C)     Body mass index is 23.1 kg/m.    ECOG FS:1 - Symptomatic but completely ambulatory  General: Well-developed, well-nourished, no acute distress. Eyes: Pink conjunctiva, anicteric sclera. Lungs: Clear to auscultation bilaterally. Heart: Regular rate and rhythm. No rubs, murmurs, or gallops. Abdomen:  Soft, nontender, nondistended. No organomegaly noted, normoactive bowel sounds. Musculoskeletal: No edema, cyanosis, or clubbing. Neuro: Alert, answering all questions appropriately. Cranial nerves grossly intact. Skin: No rashes or petechiae noted. Psych: Normal affect.    LAB RESULTS:  Lab Results  Component Value Date   NA 136 08/14/2014   K 4.7 08/14/2014   CL 107 08/14/2014   CO2 20 08/14/2014   GLUCOSE 110 (H) 08/14/2014   BUN 11 08/14/2014   CREATININE 0.68 08/14/2014   CALCIUM 8.5 08/14/2014    GFRNONAA >90 08/14/2014   GFRAA >90 08/14/2014    Lab Results  Component Value Date   WBC 6.8 08/08/2016   NEUTROABS 4.6 08/08/2016   HGB 13.5 08/08/2016   HCT 39.8 08/08/2016   MCV 89.6 08/08/2016   PLT 420 08/08/2016   No results found for: IRON, TIBC, IRONPCTSAT  No results found for: FERRITIN   STUDIES: No results found.  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  PLAN:    1. Iron deficiency anemia secondary to celiac disease: Patient's hemoglobin has improved and is well within normal limits. Iron stores are pending at time of dictation. Patient is also symptomatically improved. She does not require additional Feraheme at this time. Patient last received IV iron in July 2017. Return to clinic in 6 months with repeat laboratory work and further evaluation. She has also been instructed to continue her oral iron supplementation as prescribed. 2. Celiac disease: Treatment per GI.  Patient expressed understanding and was in agreement with this plan. She also understands that She can call clinic at any time with any questions, concerns, or complaints.    Jeralyn Ruthsimothy J Jailee Jaquez, MD   08/08/2016 2:49 PM

## 2016-08-08 ENCOUNTER — Inpatient Hospital Stay: Payer: BLUE CROSS/BLUE SHIELD

## 2016-08-08 ENCOUNTER — Inpatient Hospital Stay: Payer: BLUE CROSS/BLUE SHIELD | Attending: Oncology | Admitting: Oncology

## 2016-08-08 VITALS — BP 113/75 | HR 66 | Temp 97.2°F | Resp 18 | Wt 126.3 lb

## 2016-08-08 DIAGNOSIS — D509 Iron deficiency anemia, unspecified: Secondary | ICD-10-CM

## 2016-08-08 DIAGNOSIS — Z87442 Personal history of urinary calculi: Secondary | ICD-10-CM

## 2016-08-08 DIAGNOSIS — N3281 Overactive bladder: Secondary | ICD-10-CM | POA: Diagnosis not present

## 2016-08-08 DIAGNOSIS — M129 Arthropathy, unspecified: Secondary | ICD-10-CM | POA: Diagnosis not present

## 2016-08-08 DIAGNOSIS — K900 Celiac disease: Secondary | ICD-10-CM | POA: Diagnosis not present

## 2016-08-08 DIAGNOSIS — M818 Other osteoporosis without current pathological fracture: Secondary | ICD-10-CM | POA: Diagnosis not present

## 2016-08-08 DIAGNOSIS — D508 Other iron deficiency anemias: Secondary | ICD-10-CM

## 2016-08-08 DIAGNOSIS — Z79899 Other long term (current) drug therapy: Secondary | ICD-10-CM | POA: Diagnosis not present

## 2016-08-08 LAB — CBC WITH DIFFERENTIAL/PLATELET
BASOS ABS: 0 10*3/uL (ref 0–0.1)
Basophils Relative: 1 %
EOS PCT: 4 %
Eosinophils Absolute: 0.3 10*3/uL (ref 0–0.7)
HCT: 39.8 % (ref 35.0–47.0)
Hemoglobin: 13.5 g/dL (ref 12.0–16.0)
LYMPHS PCT: 19 %
Lymphs Abs: 1.3 10*3/uL (ref 1.0–3.6)
MCH: 30.4 pg (ref 26.0–34.0)
MCHC: 33.9 g/dL (ref 32.0–36.0)
MCV: 89.6 fL (ref 80.0–100.0)
Monocytes Absolute: 0.6 10*3/uL (ref 0.2–0.9)
Monocytes Relative: 9 %
NEUTROS ABS: 4.6 10*3/uL (ref 1.4–6.5)
Neutrophils Relative %: 67 %
PLATELETS: 420 10*3/uL (ref 150–440)
RBC: 4.44 MIL/uL (ref 3.80–5.20)
RDW: 13.4 % (ref 11.5–14.5)
WBC: 6.8 10*3/uL (ref 3.6–11.0)

## 2016-08-08 LAB — IRON AND TIBC
IRON: 64 ug/dL (ref 28–170)
SATURATION RATIOS: 18 % (ref 10.4–31.8)
TIBC: 367 ug/dL (ref 250–450)
UIBC: 303 ug/dL

## 2016-08-08 LAB — FERRITIN: Ferritin: 51 ng/mL (ref 11–307)

## 2016-08-08 NOTE — Progress Notes (Signed)
Patient offers no complaints today. 

## 2017-02-05 NOTE — Progress Notes (Signed)
[facility]  Telephone:([phone] [phone]) [phone]  ID: Lura Em OB: [birth year]  MR#: [id number]  [id number]#:[id number]  Patient Care Team: Barbette Reichmann, MD as PCP - General (Internal Medicine)  CHIEF COMPLAINT:  Iron deficiency anemia secondary to celiac disease.  INTERVAL HISTORY: Patient returns to clinic today for repeat laboratory work and further evaluation. She currently feels well and is asymptomatic. She does not complain of weakness or fatigue. She has no neurologic complaints. She denies any recent fevers or illnesses. She has a good appetite and denies weight loss. She has no chest pain or shortness of breath. She denies any nausea, vomiting, constipation, or diarrhea. She has had no recent melena or hematochezia.  She has no urinary complaints. Patient offers no specific complaints today.  REVIEW OF SYSTEMS:   Review of Systems  Constitutional: Negative for fever, malaise/fatigue and weight loss.  Respiratory: Negative.  Negative for shortness of breath.   Cardiovascular: Negative.  Negative for chest pain and leg swelling.  Gastrointestinal: Negative for abdominal pain, blood in stool, diarrhea and melena.  Genitourinary: Negative.  Negative for hematuria.  Musculoskeletal: Negative.   Neurological: Negative.  Negative for weakness.  Psychiatric/Behavioral: Negative.  The patient is not nervous/anxious.     As per HPI. Otherwise, a complete review of systems is negative.  PAST MEDICAL HISTORY: Past Medical History:  Diagnosis Date  . Arthritis   . Celiac disease   . Heart murmur   . IDA (iron deficiency anemia)   . Kidney stone   . OAB (overactive bladder)   . Osteoporosis   . Postmenopausal     PAST SURGICAL HISTORY: Past Surgical History:  Procedure Laterality Date  . CESAREAN SECTION      FAMILY HISTORY Family History  Problem Relation Age of Onset  . Kidney disease Neg Hx   . Bladder Cancer Neg Hx        ADVANCED  DIRECTIVES:    HEALTH MAINTENANCE: Social History  Substance Use Topics  . Smoking status: Never Smoker  . Smokeless tobacco: Not on file  . Alcohol use No     Colonoscopy:  PAP:  Bone density:  Lipid panel:  Allergies  Allergen Reactions  . Gluten Meal Nausea And Vomiting  . Wheat Bran Nausea And Vomiting    Current Outpatient Prescriptions  Medication Sig Dispense Refill  . alendronate (FOSAMAX) 70 MG tablet Take by mouth.    . calcium carbonate (CALCIUM 600) 600 MG TABS tablet Take 600 mg by mouth daily with breakfast.    . ferrous sulfate 325 (65 FE) MG tablet Take 325 mg by mouth daily with breakfast.     . folic acid (FOLVITE) 800 MCG tablet Take 800 mcg by mouth daily.    . Multiple Vitamins-Minerals (MULTIVITAMIN WITH MINERALS) tablet Take 1 tablet by mouth daily.    . vitamin B-12 (CYANOCOBALAMIN) 500 MCG tablet Take 500 mcg by mouth daily.     No current facility-administered medications for this visit.     OBJECTIVE: Vitals:   02/07/17 1351  BP: 120/72  Pulse: 66  Resp: 18  Temp: 97.6 F (36.4 C)     Body mass index is 21.05 kg/m.    ECOG FS:1 - Symptomatic but completely ambulatory  General: Well-developed, well-nourished, no acute distress. Eyes: Pink conjunctiva, anicteric sclera. Lungs: Clear to auscultation bilaterally. Heart: Regular rate and rhythm. No rubs, murmurs, or gallops. Abdomen: Soft, nontender, nondistended. No organomegaly noted, normoactive bowel sounds. Musculoskeletal: No edema, cyanosis, or clubbing. Neuro:  Alert, answering all questions appropriately. Cranial nerves grossly intact. Skin: No rashes or petechiae noted. Psych: Normal affect.    LAB RESULTS:  Lab Results  Component Value Date   NA 136 08/14/2014   K 4.7 08/14/2014   CL 107 08/14/2014   CO2 20 08/14/2014   GLUCOSE 110 (H) 08/14/2014   BUN 11 08/14/2014   CREATININE 0.68 08/14/2014   CALCIUM 8.5 08/14/2014   GFRNONAA >90 08/14/2014   GFRAA >90  08/14/2014    Lab Results  Component Value Date   WBC 6.0 02/06/2017   NEUTROABS 4.3 02/06/2017   HGB 12.5 02/06/2017   HCT 36.2 02/06/2017   MCV 88.0 02/06/2017   PLT 386 02/06/2017   Lab Results  Component Value Date   IRON 44 02/06/2017   TIBC 348 02/06/2017   IRONPCTSAT 13 02/06/2017    Lab Results  Component Value Date   FERRITIN 9 (L) 02/06/2017     STUDIES: No results found.  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  PLAN:    1. Iron deficiency anemia secondary to celiac disease: Patient's hemoglobin Is now within normal limits. Her iron stores are slightly low including a decreased ferritin at 9. Will proceed with one infusion of 510 mg IV Feraheme today. Patient will then return to clinic in 6 months with repeat laboratory work and further evaluation. She has also been instructed to continue her oral iron supplementation as prescribed. 2. Celiac disease: Treatment per GI.  Approximately 30 minutes was spent in discussion of which greater than 50% was consultation.  Patient expressed understanding and was in agreement with this plan. She also understands that She can call clinic at any time with any questions, concerns, or complaints.    Jeralyn Ruthsimothy J Finnegan, MD   02/12/2017 11:30 AM

## 2017-02-06 ENCOUNTER — Inpatient Hospital Stay: Payer: BLUE CROSS/BLUE SHIELD

## 2017-02-06 DIAGNOSIS — K900 Celiac disease: Secondary | ICD-10-CM | POA: Insufficient documentation

## 2017-02-06 DIAGNOSIS — M810 Age-related osteoporosis without current pathological fracture: Secondary | ICD-10-CM | POA: Diagnosis not present

## 2017-02-06 DIAGNOSIS — D508 Other iron deficiency anemias: Secondary | ICD-10-CM

## 2017-02-06 DIAGNOSIS — Z87440 Personal history of urinary (tract) infections: Secondary | ICD-10-CM | POA: Insufficient documentation

## 2017-02-06 DIAGNOSIS — Z87442 Personal history of urinary calculi: Secondary | ICD-10-CM | POA: Diagnosis not present

## 2017-02-06 DIAGNOSIS — D509 Iron deficiency anemia, unspecified: Secondary | ICD-10-CM | POA: Insufficient documentation

## 2017-02-06 DIAGNOSIS — R011 Cardiac murmur, unspecified: Secondary | ICD-10-CM | POA: Insufficient documentation

## 2017-02-06 DIAGNOSIS — Z79899 Other long term (current) drug therapy: Secondary | ICD-10-CM | POA: Insufficient documentation

## 2017-02-06 DIAGNOSIS — M129 Arthropathy, unspecified: Secondary | ICD-10-CM | POA: Insufficient documentation

## 2017-02-06 DIAGNOSIS — N3281 Overactive bladder: Secondary | ICD-10-CM | POA: Diagnosis not present

## 2017-02-06 LAB — CBC WITH DIFFERENTIAL/PLATELET
BASOS ABS: 0 10*3/uL (ref 0–0.1)
BASOS PCT: 1 %
Eosinophils Absolute: 0.1 10*3/uL (ref 0–0.7)
Eosinophils Relative: 1 %
HEMATOCRIT: 36.2 % (ref 35.0–47.0)
Hemoglobin: 12.5 g/dL (ref 12.0–16.0)
LYMPHS PCT: 18 %
Lymphs Abs: 1.1 10*3/uL (ref 1.0–3.6)
MCH: 30.4 pg (ref 26.0–34.0)
MCHC: 34.6 g/dL (ref 32.0–36.0)
MCV: 88 fL (ref 80.0–100.0)
Monocytes Absolute: 0.4 10*3/uL (ref 0.2–0.9)
Monocytes Relative: 7 %
NEUTROS ABS: 4.3 10*3/uL (ref 1.4–6.5)
Neutrophils Relative %: 73 %
Platelets: 386 10*3/uL (ref 150–440)
RBC: 4.11 MIL/uL (ref 3.80–5.20)
RDW: 13.7 % (ref 11.5–14.5)
WBC: 6 10*3/uL (ref 3.6–11.0)

## 2017-02-06 LAB — IRON AND TIBC
Iron: 44 ug/dL (ref 28–170)
Saturation Ratios: 13 % (ref 10.4–31.8)
TIBC: 348 ug/dL (ref 250–450)
UIBC: 305 ug/dL

## 2017-02-06 LAB — FERRITIN: FERRITIN: 9 ng/mL — AB (ref 11–307)

## 2017-02-07 ENCOUNTER — Inpatient Hospital Stay: Payer: BLUE CROSS/BLUE SHIELD | Attending: Oncology | Admitting: Oncology

## 2017-02-07 ENCOUNTER — Inpatient Hospital Stay: Payer: BLUE CROSS/BLUE SHIELD

## 2017-02-07 VITALS — BP 108/73 | HR 55

## 2017-02-07 VITALS — BP 120/72 | HR 66 | Temp 97.6°F | Resp 18 | Wt 115.1 lb

## 2017-02-07 DIAGNOSIS — N3281 Overactive bladder: Secondary | ICD-10-CM | POA: Diagnosis not present

## 2017-02-07 DIAGNOSIS — Z87440 Personal history of urinary (tract) infections: Secondary | ICD-10-CM

## 2017-02-07 DIAGNOSIS — D508 Other iron deficiency anemias: Secondary | ICD-10-CM

## 2017-02-07 DIAGNOSIS — Z79899 Other long term (current) drug therapy: Secondary | ICD-10-CM

## 2017-02-07 DIAGNOSIS — D509 Iron deficiency anemia, unspecified: Secondary | ICD-10-CM | POA: Diagnosis not present

## 2017-02-07 DIAGNOSIS — Z87442 Personal history of urinary calculi: Secondary | ICD-10-CM | POA: Diagnosis not present

## 2017-02-07 DIAGNOSIS — K900 Celiac disease: Secondary | ICD-10-CM | POA: Diagnosis not present

## 2017-02-07 DIAGNOSIS — M129 Arthropathy, unspecified: Secondary | ICD-10-CM

## 2017-02-07 DIAGNOSIS — M810 Age-related osteoporosis without current pathological fracture: Secondary | ICD-10-CM | POA: Diagnosis not present

## 2017-02-07 DIAGNOSIS — R011 Cardiac murmur, unspecified: Secondary | ICD-10-CM | POA: Diagnosis not present

## 2017-02-07 MED ORDER — SODIUM CHLORIDE 0.9 % IV SOLN
Freq: Once | INTRAVENOUS | Status: AC
Start: 2017-02-07 — End: 2017-02-07
  Administered 2017-02-07: 15:00:00 via INTRAVENOUS
  Filled 2017-02-07: qty 1000

## 2017-02-07 MED ORDER — SODIUM CHLORIDE 0.9 % IV SOLN
510.0000 mg | Freq: Once | INTRAVENOUS | Status: AC
  Administered 2017-02-07: 510 mg via INTRAVENOUS
  Filled 2017-02-07: qty 17

## 2017-02-07 NOTE — Progress Notes (Signed)
Patient is here for follow up of her anemia

## 2017-08-06 NOTE — Progress Notes (Signed)
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  INTERVAL HISTORY: Patient returns to clinic today for repeat laboratory work and further evaluation.  She has noticed some increased weakness and fatigue recently, but otherwise has felt well. She has no neurologic complaints. She denies any recent fevers or illnesses. She has a good appetite and denies weight loss. She has no chest pain or shortness of breath. She denies any nausea, vomiting, constipation, or diarrhea. She has had no recent melena or hematochezia.  She has no urinary complaints. Patient offers no further specific complaints today.  REVIEW OF SYSTEMS:   Review of Systems  Constitutional: Positive for malaise/fatigue. Negative for fever and weight loss.  Respiratory: Negative.  Negative for shortness of breath.   Cardiovascular: Negative.  Negative for chest pain and leg swelling.  Gastrointestinal: Negative for abdominal pain, blood in stool, diarrhea and melena.  Genitourinary: Negative.  Negative for hematuria.  Musculoskeletal: Negative.   Skin: Negative.  Negative for rash.  Neurological: Positive for weakness.  Psychiatric/Behavioral: Negative.  The patient is not nervous/anxious.     As per HPI. Otherwise, a complete review of systems is negative.  PAST MEDICAL HISTORY: Past Medical History:  Diagnosis Date  . Arthritis   . Celiac disease   . Heart murmur   . IDA (iron deficiency anemia)   . Kidney stone   . OAB (overactive bladder)   . Osteoporosis   . Postmenopausal     PAST SURGICAL HISTORY: Past Surgical History:  Procedure Laterality Date  . CESAREAN SECTION      FAMILY HISTORY Family History  Problem Relation Age of Onset  . Kidney disease Neg Hx   .  Bladder Cancer Neg Hx        ADVANCED DIRECTIVES:    HEALTH MAINTENANCE: Social History   Tobacco Use  . Smoking status: Never Smoker  Substance Use Topics  . Alcohol use: No    Alcohol/week: 0.0 oz  . Drug use: No     Colonoscopy:  PAP:  Bone density:  Lipid panel:  Allergies  Allergen Reactions  . Gluten Meal Nausea And Vomiting  . Wheat Bran Nausea And Vomiting    Current Outpatient Medications  Medication Sig Dispense Refill  . alendronate (FOSAMAX) 70 MG tablet Take by mouth.    . calcium carbonate (CALCIUM 600) 600 MG TABS tablet Take 600 mg by mouth daily with breakfast.    . ferrous sulfate 325 (65 FE) MG tablet Take 325 mg by mouth daily with breakfast.     . Multiple Vitamins-Minerals (MULTIVITAMIN WITH MINERALS) tablet Take 1 tablet by mouth daily.    . folic acid (FOLVITE) 800 MCG tablet Take 800 mcg by mouth daily.    . vitamin B-12 (CYANOCOBALAMIN) 500 MCG tablet Take 500 mcg by mouth daily.     No current facility-administered medications for this visit.     OBJECTIVE: Vitals:   08/08/17 1353  BP: 119/67  Pulse: 64  Resp: 18  Temp: 98.2 F (36.8 C)     Body mass index is 21.69 kg/m.    ECOG FS:1 - Symptomatic but completely ambulatory  General: Well-developed, well-nourished, no acute distress. Eyes: Pink conjunctiva, anicteric sclera. Lungs: Clear to auscultation bilaterally. Heart: Regular rate and rhythm. No rubs, murmurs, or gallops. Abdomen:  Soft, nontender, nondistended. No organomegaly noted, normoactive bowel sounds. Musculoskeletal: No edema, cyanosis, or clubbing. Neuro: Alert, answering all questions appropriately. Cranial nerves grossly intact. Skin: No rashes or petechiae noted. Psych: Normal affect.    LAB RESULTS:  Lab Results  Component Value Date   NA 136 08/14/2014   K 4.7 08/14/2014   CL 107 08/14/2014   CO2 20 08/14/2014   GLUCOSE 110 (H) 08/14/2014   BUN 11 08/14/2014   CREATININE 0.68 08/14/2014   CALCIUM  8.5 08/14/2014   GFRNONAA >90 08/14/2014   GFRAA >90 08/14/2014    Lab Results  Component Value Date   WBC 6.0 08/07/2017   NEUTROABS 4.4 08/07/2017   HGB 13.4 08/07/2017   HCT 40.3 08/07/2017   MCV 91.8 08/07/2017   PLT 396 08/07/2017   Lab Results  Component Value Date   IRON 53 08/07/2017   TIBC 373 08/07/2017   IRONPCTSAT 14 08/07/2017    Lab Results  Component Value Date   FERRITIN 10 (L) 08/07/2017     STUDIES: No results found.  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  PLAN:    1. 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  Previously, the remainder of her laboratory work was either negative or within normal limits.  Proceed with 510 mg IV Feraheme today.  Patient will return to clinic in 1 week to receive a second dose. Patient will then return to clinic in 6 months with repeat laboratory work and further evaluation. She has also been instructed to continue her oral iron supplementation as prescribed. 2. Celiac disease: Treatment per GI.  Approximately 30 minutes was spent in discussion of which greater than 50% was consultation.  Patient expressed understanding and was in agreement with this plan. She also understands that She can call clinic at any time with any questions, concerns, or complaints.    Jeralyn Ruths, MD   08/12/2017 7:01 AM

## 2017-08-07 ENCOUNTER — Inpatient Hospital Stay: Payer: BLUE CROSS/BLUE SHIELD | Attending: Oncology | Admitting: *Deleted

## 2017-08-07 DIAGNOSIS — D509 Iron deficiency anemia, unspecified: Secondary | ICD-10-CM | POA: Diagnosis not present

## 2017-08-07 DIAGNOSIS — N3281 Overactive bladder: Secondary | ICD-10-CM | POA: Diagnosis not present

## 2017-08-07 DIAGNOSIS — Z87442 Personal history of urinary calculi: Secondary | ICD-10-CM | POA: Diagnosis not present

## 2017-08-07 DIAGNOSIS — R5383 Other fatigue: Secondary | ICD-10-CM | POA: Diagnosis not present

## 2017-08-07 DIAGNOSIS — Z79899 Other long term (current) drug therapy: Secondary | ICD-10-CM | POA: Insufficient documentation

## 2017-08-07 DIAGNOSIS — M810 Age-related osteoporosis without current pathological fracture: Secondary | ICD-10-CM | POA: Diagnosis not present

## 2017-08-07 DIAGNOSIS — K900 Celiac disease: Secondary | ICD-10-CM | POA: Insufficient documentation

## 2017-08-07 DIAGNOSIS — R011 Cardiac murmur, unspecified: Secondary | ICD-10-CM | POA: Insufficient documentation

## 2017-08-07 DIAGNOSIS — R531 Weakness: Secondary | ICD-10-CM | POA: Diagnosis not present

## 2017-08-07 LAB — CBC WITH DIFFERENTIAL/PLATELET
BASOS PCT: 1 %
Basophils Absolute: 0 10*3/uL (ref 0–0.1)
EOS PCT: 3 %
Eosinophils Absolute: 0.2 10*3/uL (ref 0–0.7)
HEMATOCRIT: 40.3 % (ref 35.0–47.0)
Hemoglobin: 13.4 g/dL (ref 12.0–16.0)
Lymphocytes Relative: 16 %
Lymphs Abs: 1 10*3/uL (ref 1.0–3.6)
MCH: 30.6 pg (ref 26.0–34.0)
MCHC: 33.3 g/dL (ref 32.0–36.0)
MCV: 91.8 fL (ref 80.0–100.0)
MONO ABS: 0.5 10*3/uL (ref 0.2–0.9)
Monocytes Relative: 8 %
NEUTROS ABS: 4.4 10*3/uL (ref 1.4–6.5)
Neutrophils Relative %: 72 %
Platelets: 396 10*3/uL (ref 150–440)
RBC: 4.39 MIL/uL (ref 3.80–5.20)
RDW: 13.6 % (ref 11.5–14.5)
WBC: 6 10*3/uL (ref 3.6–11.0)

## 2017-08-07 LAB — FERRITIN: FERRITIN: 10 ng/mL — AB (ref 11–307)

## 2017-08-07 LAB — IRON AND TIBC
IRON: 53 ug/dL (ref 28–170)
SATURATION RATIOS: 14 % (ref 10.4–31.8)
TIBC: 373 ug/dL (ref 250–450)
UIBC: 320 ug/dL

## 2017-08-08 ENCOUNTER — Inpatient Hospital Stay: Payer: BLUE CROSS/BLUE SHIELD

## 2017-08-08 ENCOUNTER — Inpatient Hospital Stay (HOSPITAL_BASED_OUTPATIENT_CLINIC_OR_DEPARTMENT_OTHER): Payer: BLUE CROSS/BLUE SHIELD | Admitting: Oncology

## 2017-08-08 VITALS — BP 119/67 | HR 64 | Temp 98.2°F | Resp 18 | Wt 118.6 lb

## 2017-08-08 VITALS — BP 105/69 | HR 61

## 2017-08-08 DIAGNOSIS — K5900 Constipation, unspecified: Secondary | ICD-10-CM | POA: Diagnosis not present

## 2017-08-08 DIAGNOSIS — R531 Weakness: Secondary | ICD-10-CM | POA: Diagnosis not present

## 2017-08-08 DIAGNOSIS — D508 Other iron deficiency anemias: Secondary | ICD-10-CM

## 2017-08-08 DIAGNOSIS — R011 Cardiac murmur, unspecified: Secondary | ICD-10-CM

## 2017-08-08 DIAGNOSIS — R5383 Other fatigue: Secondary | ICD-10-CM | POA: Diagnosis not present

## 2017-08-08 DIAGNOSIS — M810 Age-related osteoporosis without current pathological fracture: Secondary | ICD-10-CM | POA: Diagnosis not present

## 2017-08-08 DIAGNOSIS — D509 Iron deficiency anemia, unspecified: Secondary | ICD-10-CM

## 2017-08-08 DIAGNOSIS — Z87442 Personal history of urinary calculi: Secondary | ICD-10-CM

## 2017-08-08 DIAGNOSIS — N3281 Overactive bladder: Secondary | ICD-10-CM | POA: Diagnosis not present

## 2017-08-08 DIAGNOSIS — Z79899 Other long term (current) drug therapy: Secondary | ICD-10-CM

## 2017-08-08 MED ORDER — SODIUM CHLORIDE 0.9 % IV SOLN
Freq: Once | INTRAVENOUS | Status: AC
Start: 2017-08-08 — End: 2017-08-08
  Administered 2017-08-08: 15:00:00 via INTRAVENOUS
  Filled 2017-08-08: qty 1000

## 2017-08-08 MED ORDER — SODIUM CHLORIDE 0.9 % IV SOLN
510.0000 mg | Freq: Once | INTRAVENOUS | Status: AC
  Administered 2017-08-08: 510 mg via INTRAVENOUS
  Filled 2017-08-08: qty 17

## 2017-08-15 ENCOUNTER — Other Ambulatory Visit: Payer: Self-pay | Admitting: Oncology

## 2017-08-15 ENCOUNTER — Inpatient Hospital Stay: Payer: BLUE CROSS/BLUE SHIELD

## 2017-08-15 VITALS — BP 109/78 | HR 68 | Resp 18

## 2017-08-15 DIAGNOSIS — D508 Other iron deficiency anemias: Secondary | ICD-10-CM

## 2017-08-15 DIAGNOSIS — D509 Iron deficiency anemia, unspecified: Secondary | ICD-10-CM | POA: Diagnosis not present

## 2017-08-15 MED ORDER — SODIUM CHLORIDE 0.9 % IV SOLN
510.0000 mg | Freq: Once | INTRAVENOUS | Status: AC
  Administered 2017-08-15: 510 mg via INTRAVENOUS
  Filled 2017-08-15: qty 17

## 2017-08-15 MED ORDER — SODIUM CHLORIDE 0.9 % IV SOLN
Freq: Once | INTRAVENOUS | Status: AC
  Administered 2017-08-15: 14:00:00 via INTRAVENOUS
  Filled 2017-08-15: qty 1000

## 2018-02-09 ENCOUNTER — Other Ambulatory Visit: Payer: Self-pay | Admitting: *Deleted

## 2018-02-09 DIAGNOSIS — D509 Iron deficiency anemia, unspecified: Secondary | ICD-10-CM

## 2018-02-09 NOTE — Progress Notes (Signed)
cbc

## 2018-02-12 NOTE — Progress Notes (Deleted)
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  INTERVAL HISTORY: Patient returns to clinic today for repeat laboratory work and further evaluation.  She has noticed some increased weakness and fatigue recently, but otherwise has felt well. She has no neurologic complaints. She denies any recent fevers or illnesses. She has a good appetite and denies weight loss. She has no chest pain or shortness of breath. She denies any nausea, vomiting, constipation, or diarrhea. She has had no recent melena or hematochezia.  She has no urinary complaints. Patient offers no further specific complaints today.  REVIEW OF SYSTEMS:   Review of Systems  Constitutional: Positive for malaise/fatigue. Negative for fever and weight loss.  Respiratory: Negative.  Negative for shortness of breath.   Cardiovascular: Negative.  Negative for chest pain and leg swelling.  Gastrointestinal: Negative for abdominal pain, blood in stool, diarrhea and melena.  Genitourinary: Negative.  Negative for hematuria.  Musculoskeletal: Negative.   Skin: Negative.  Negative for rash.  Neurological: Positive for weakness.  Psychiatric/Behavioral: Negative.  The patient is not nervous/anxious.     As per HPI. Otherwise, a complete review of systems is negative.  PAST MEDICAL HISTORY: Past Medical History:  Diagnosis Date  . Arthritis   . Celiac disease   . Heart murmur   . IDA (iron deficiency anemia)   . Kidney stone   . OAB (overactive bladder)   . Osteoporosis   . Postmenopausal     PAST SURGICAL HISTORY: Past Surgical History:  Procedure Laterality Date  . CESAREAN SECTION      FAMILY HISTORY Family History  Problem Relation Age of Onset  . Kidney disease Neg Hx   .  Bladder Cancer Neg Hx        ADVANCED DIRECTIVES:    HEALTH MAINTENANCE: Social History   Tobacco Use  . Smoking status: Never Smoker  Substance Use Topics  . Alcohol use: No    Alcohol/week: 0.0 oz  . Drug use: No     Colonoscopy:  PAP:  Bone density:  Lipid panel:  Allergies  Allergen Reactions  . Gluten Meal Nausea And Vomiting  . Wheat Bran Nausea And Vomiting    Current Outpatient Medications  Medication Sig Dispense Refill  . calcium carbonate (CALCIUM 600) 600 MG TABS tablet Take 600 mg by mouth daily with breakfast.    . ferrous sulfate 325 (65 FE) MG tablet Take 325 mg by mouth daily with breakfast.     . folic acid (FOLVITE) 800 MCG tablet Take 800 mcg by mouth daily.    . Multiple Vitamins-Minerals (MULTIVITAMIN WITH MINERALS) tablet Take 1 tablet by mouth daily.    . vitamin B-12 (CYANOCOBALAMIN) 500 MCG tablet Take 500 mcg by mouth daily.     No current facility-administered medications for this visit.     OBJECTIVE: There were no vitals filed for this visit.   There is no height or weight on file to calculate BMI.    ECOG FS:1 - Symptomatic but completely ambulatory  General: Well-developed, well-nourished, no acute distress. Eyes: Pink conjunctiva, anicteric sclera. Lungs: Clear to auscultation bilaterally. Heart: Regular rate and rhythm. No rubs, murmurs, or gallops. Abdomen: Soft, nontender, nondistended. No organomegaly noted, normoactive bowel sounds. Musculoskeletal: No edema, cyanosis, or clubbing. Neuro: Alert, answering all questions appropriately.  Cranial nerves grossly intact. Skin: No rashes or petechiae noted. Psych: Normal affect.    LAB RESULTS:  Lab Results  Component Value Date   NA 136 08/14/2014   K 4.7 08/14/2014   CL 107 08/14/2014   CO2 20 08/14/2014   GLUCOSE 110 (H) 08/14/2014   BUN 11 08/14/2014   CREATININE 0.68 08/14/2014   CALCIUM 8.5 08/14/2014   GFRNONAA >90 08/14/2014   GFRAA >90 08/14/2014    Lab  Results  Component Value Date   WBC 6.0 08/07/2017   NEUTROABS 4.4 08/07/2017   HGB 13.4 08/07/2017   HCT 40.3 08/07/2017   MCV 91.8 08/07/2017   PLT 396 08/07/2017   Lab Results  Component Value Date   IRON 53 08/07/2017   TIBC 373 08/07/2017   IRONPCTSAT 14 08/07/2017    Lab Results  Component Value Date   FERRITIN 10 (L) 08/07/2017     STUDIES: No results found.  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  PLAN:    1. 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  Previously, the remainder of her laboratory work was either negative or within normal limits.  Proceed with 510 mg IV Feraheme today.  Patient will return to clinic in 1 week to receive a second dose. Patient will then return to clinic in 6 months with repeat laboratory work and further evaluation. She has also been instructed to continue her oral iron supplementation as prescribed. 2. Celiac disease: Treatment per GI.  Approximately 30 minutes was spent in discussion of which greater than 50% was consultation.  Patient expressed understanding and was in agreement with this plan. She also understands that She can call clinic at any time with any questions, concerns, or complaints.    Jeralyn Ruths, MD   02/12/2018 11:12 PM

## 2018-02-13 ENCOUNTER — Inpatient Hospital Stay: Payer: BLUE CROSS/BLUE SHIELD

## 2018-02-13 ENCOUNTER — Inpatient Hospital Stay: Payer: BLUE CROSS/BLUE SHIELD | Admitting: Oncology

## 2018-05-22 ENCOUNTER — Other Ambulatory Visit: Payer: Self-pay | Admitting: Family Medicine

## 2018-05-22 DIAGNOSIS — R1011 Right upper quadrant pain: Secondary | ICD-10-CM

## 2018-05-25 ENCOUNTER — Ambulatory Visit
Admission: RE | Admit: 2018-05-25 | Discharge: 2018-05-25 | Disposition: A | Discharge status: Home / Self Care | Payer: BLUE CROSS/BLUE SHIELD | Source: Ambulatory Visit | Attending: Family Medicine | Admitting: Family Medicine

## 2018-05-25 DIAGNOSIS — R1011 Right upper quadrant pain: Secondary | ICD-10-CM

## 2019-08-20 LAB — HM DEXA SCAN

## 2019-08-20 LAB — HM MAMMOGRAPHY

## 2020-01-05 IMAGING — US US ABDOMEN LIMITED
1 series · 14 of 25 positions shown · non-contrast
Comparison: None.

CLINICAL DATA: Right upper quadrant pain

EXAM:
ULTRASOUND ABDOMEN LIMITED RIGHT UPPER QUADRANT

[Series 1: us abdomen limited · 0.17mm/px · 14 of 57 slices shown]
[im 1/57]
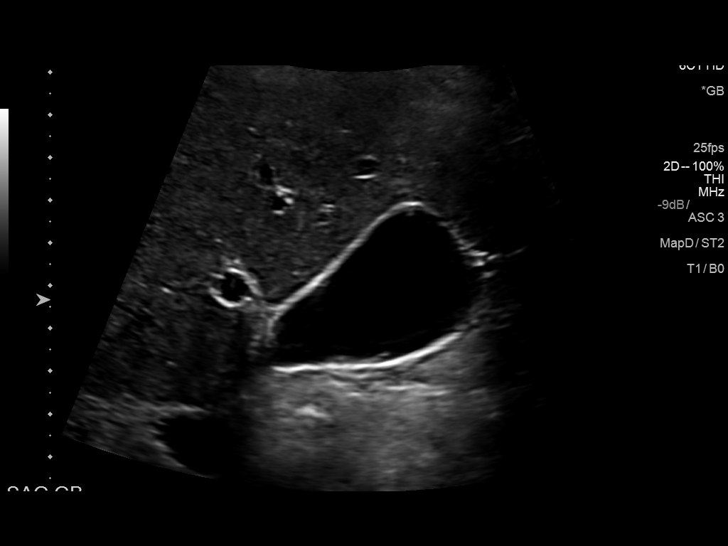
[im 5/57]
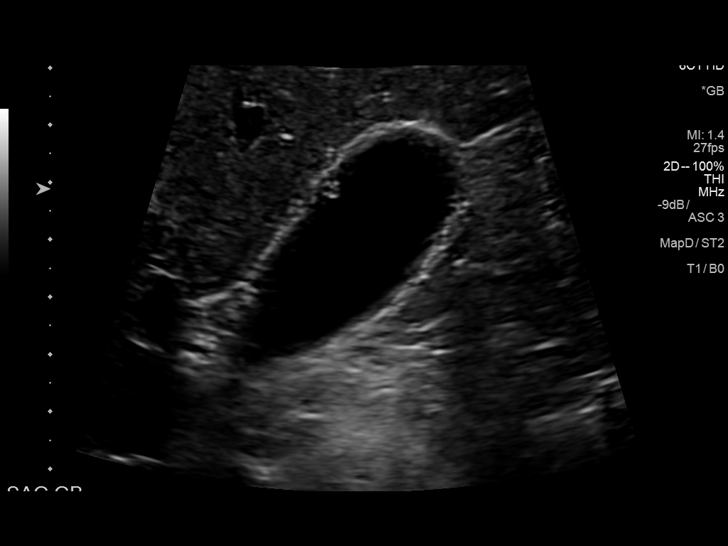
[im 10/57]
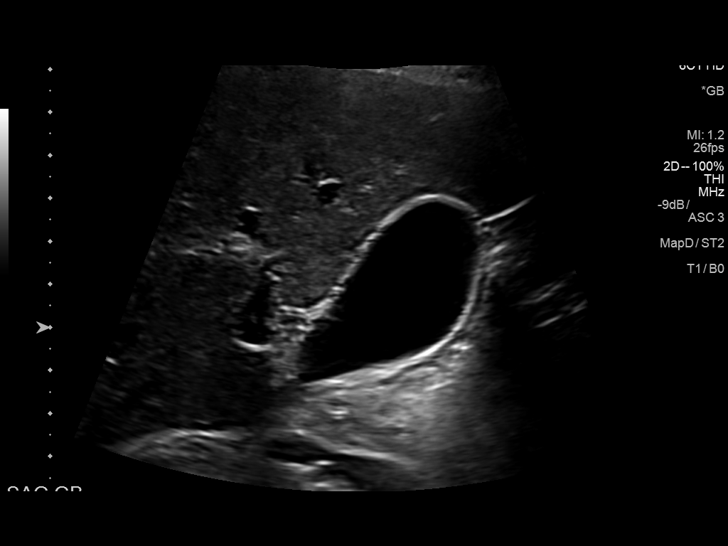
[im 15/57]
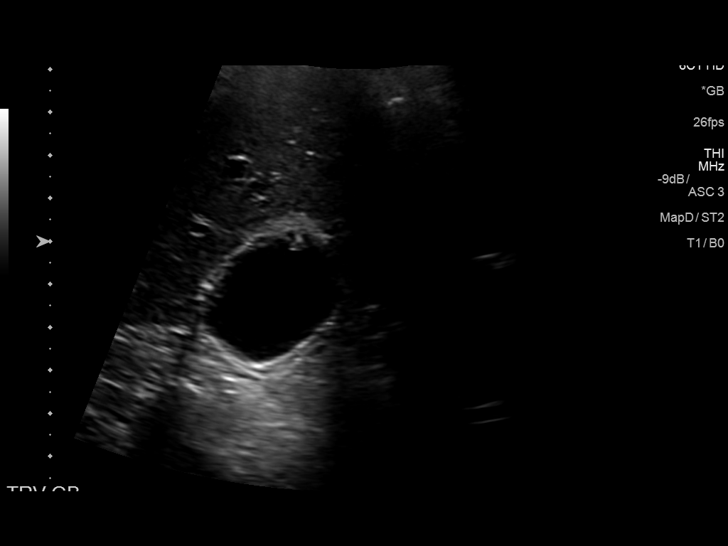
[im 19/57]
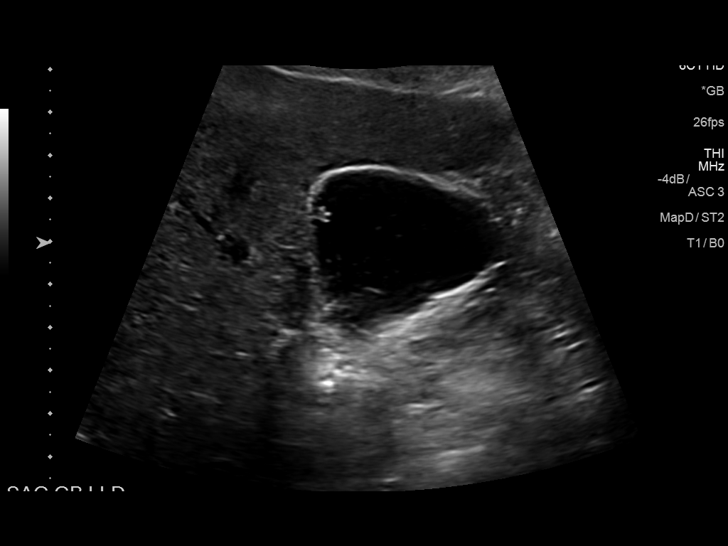
[im 22/57]
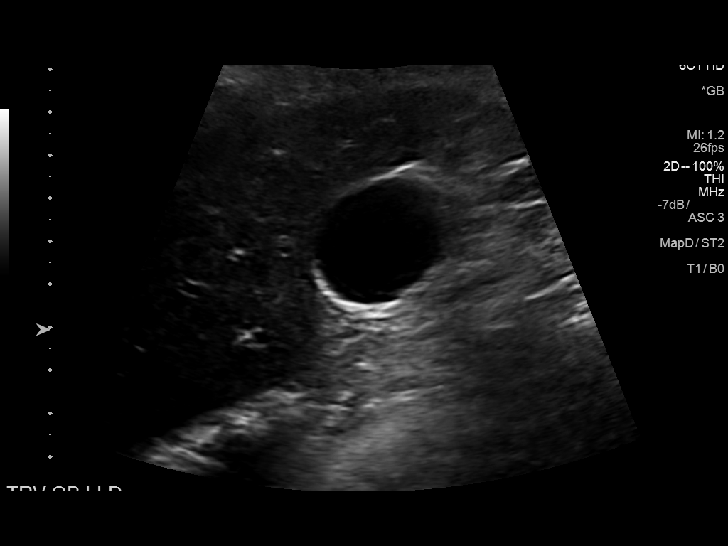
[im 26/57]
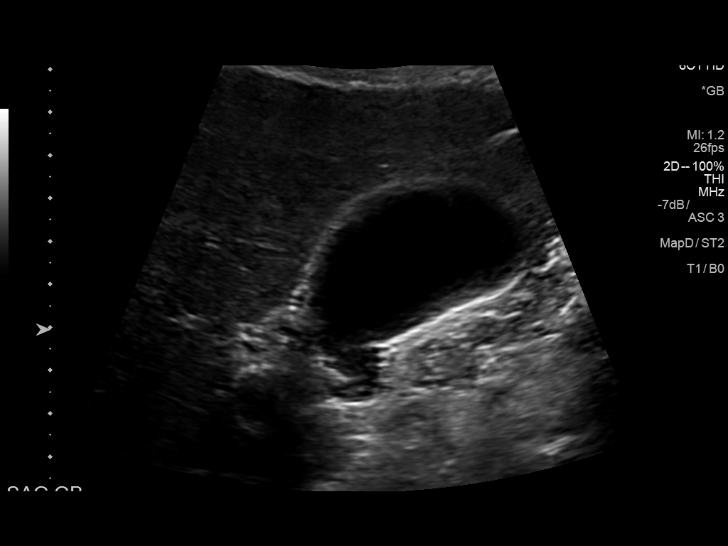
[im 31/57]
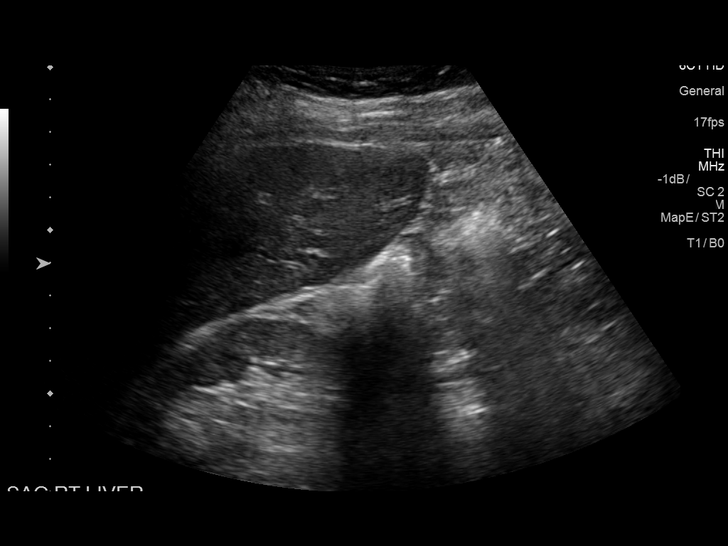
[im 36/57]
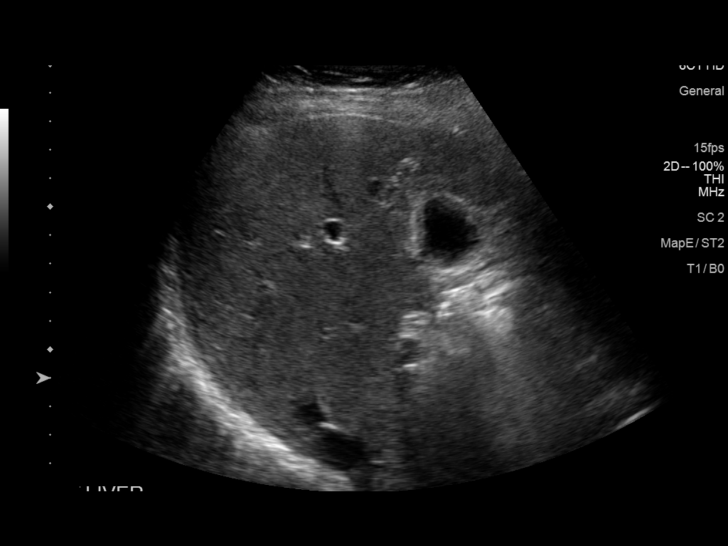
[im 38/57]
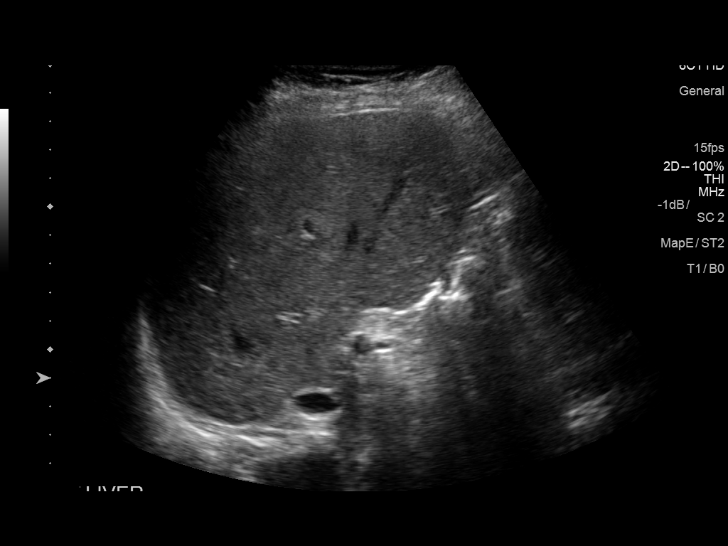
[im 43/57]
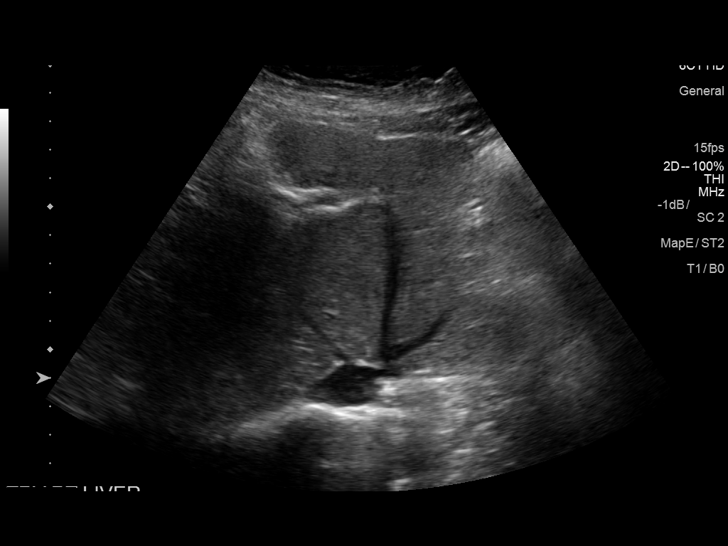
[im 47/57]
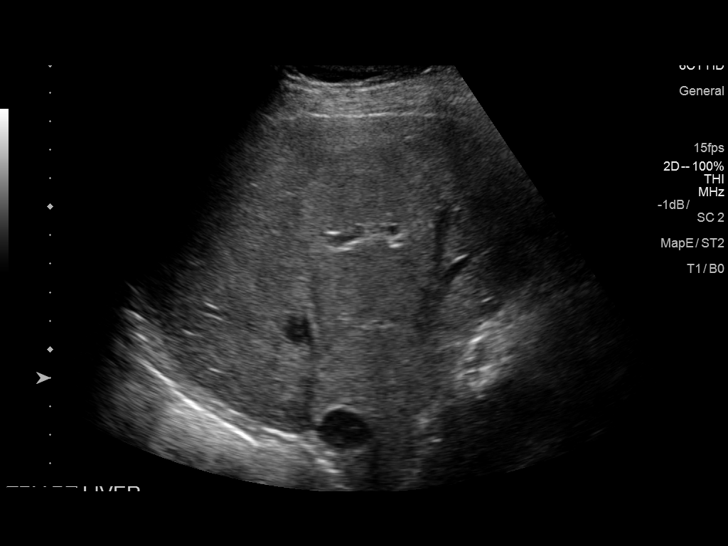
[im 52/57]
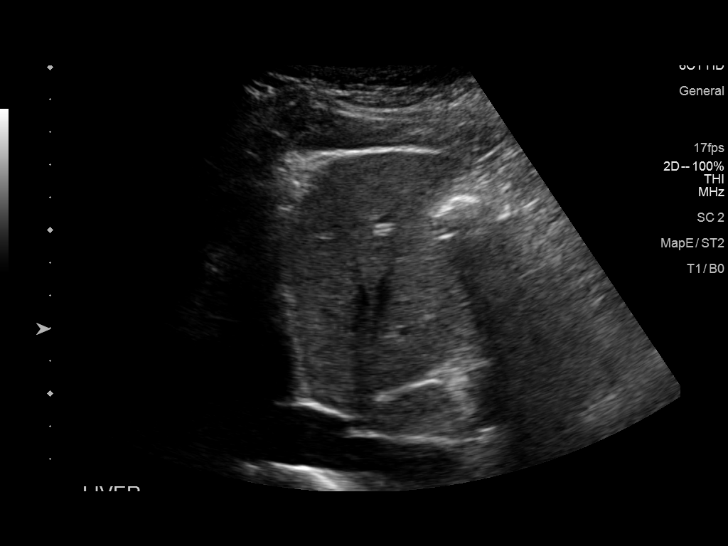
[im 57/57]
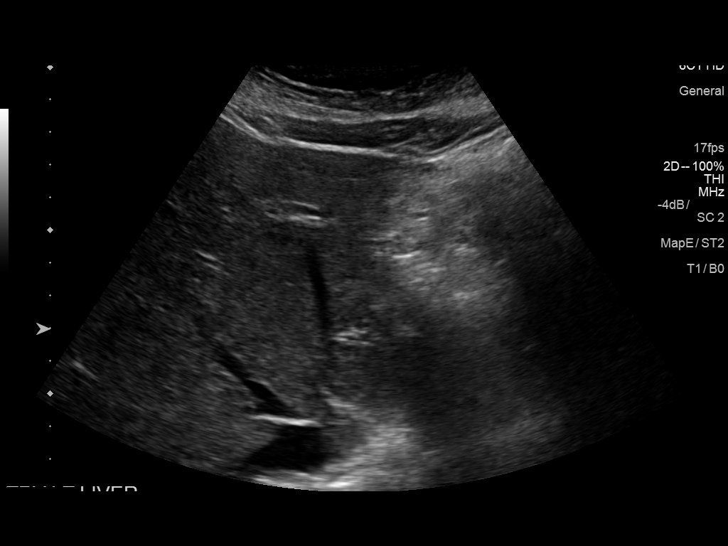

[14 of 25 positions shown; findings below may reference images not displayed]

FINDINGS: Gallbladder:

There is an apparent polyp along the anterior wall of the
gallbladder measuring just over 3 mm. A posterior echogenic focus
which neither moves nor shadows measuring 3 mm is also noted. This
area is also felt to represent a polyp. There are no echogenic foci
in the gallbladder which move and shadow as is expected with
gallstones. There is no gallbladder wall thickening or
pericholecystic fluid. No sonographic Murphy sign noted by
sonographer.

Common bile duct:

Diameter: 3 mm.  No intrahepatic biliary duct dilatation.

Liver:

No focal lesion identified. Within normal limits in parenchymal
echogenicity. Portal vein is patent on color Doppler imaging with
normal direction of blood flow towards the liver.
IMPRESSION: There are two 3 mm echogenic foci which neither move nor shadow,
felt to represent small polyps within the gallbladder. Gallbladder
otherwise appears normal. Per consensus guidelines, polyps of this
small size do not warrant additional imaging surveillance.

Study otherwise unremarkable.

## 2021-09-02 ENCOUNTER — Encounter: Payer: Self-pay | Admitting: Oncology

## 2021-09-22 ENCOUNTER — Ambulatory Visit: Payer: BC Managed Care – PPO | Admitting: Nurse Practitioner

## 2021-09-22 ENCOUNTER — Encounter: Payer: Self-pay | Admitting: Nurse Practitioner

## 2021-09-22 ENCOUNTER — Other Ambulatory Visit: Payer: Self-pay

## 2021-09-22 VITALS — BP 124/76 | HR 68 | Temp 97.7°F | Ht 63.0 in | Wt 129.6 lb

## 2021-09-22 DIAGNOSIS — E538 Deficiency of other specified B group vitamins: Secondary | ICD-10-CM | POA: Diagnosis not present

## 2021-09-22 DIAGNOSIS — H9312 Tinnitus, left ear: Secondary | ICD-10-CM

## 2021-09-22 DIAGNOSIS — H539 Unspecified visual disturbance: Secondary | ICD-10-CM

## 2021-09-22 DIAGNOSIS — E7800 Pure hypercholesterolemia, unspecified: Secondary | ICD-10-CM

## 2021-09-22 DIAGNOSIS — D508 Other iron deficiency anemias: Secondary | ICD-10-CM | POA: Diagnosis not present

## 2021-09-22 DIAGNOSIS — M810 Age-related osteoporosis without current pathological fracture: Secondary | ICD-10-CM | POA: Diagnosis not present

## 2021-09-22 DIAGNOSIS — M79672 Pain in left foot: Secondary | ICD-10-CM

## 2021-09-22 DIAGNOSIS — R7989 Other specified abnormal findings of blood chemistry: Secondary | ICD-10-CM | POA: Diagnosis not present

## 2021-09-22 DIAGNOSIS — R5383 Other fatigue: Secondary | ICD-10-CM | POA: Diagnosis not present

## 2021-09-22 DIAGNOSIS — H6592 Unspecified nonsuppurative otitis media, left ear: Secondary | ICD-10-CM

## 2021-09-22 NOTE — Patient Instructions (Signed)
It was great to see you! ? ?Start taking calcium and vitamin D supplement over the counter. I have ordered a mammogram and dexa scan to follow on your osteoporosis.  ? ?I have placed an order for screening mammogram, they should call you to schedule. If you do not hear from them in the next week, please call: ? ?[facility] ?[address] ?[place], [place] ?[phone] ? ?Continue flonase and start either loratidine (claritin) or cetirizine (zyrtec) daily to help with your ear. Call if you still have ongoing pressure in your left ear and I will send in some prednisone to help your symptoms.  ? ?I have placed a referral to podiatry and ENT.  ? ?Let's follow-up in 6 months, sooner if you have concerns. ? ?If a referral was placed today, you will be contacted for an appointment. Please note that routine referrals can sometimes take up to 3-4 weeks to process. Please call our office if you haven't heard anything after this time frame. ? ?Take care, ? ?Rodman Pickle, NP ? ?

## 2021-09-22 NOTE — Progress Notes (Signed)
New Patient Office Visit  Subjective:  Patient ID: Carla Cortez, female    DOB: 09-19-1955  Age: 66 y.o. MRN: [id number]  CC:  Chief Complaint  Patient presents with   Establish Care    Np. Est care. Pt c/o fatigue several months.     HPI Carla Cortez presents for new patient visit to establish care.  Introduced to Publishing rights manager role and practice setting.  All questions answered.  Discussed provider/patient relationship and expectations.  Endorses fatigue for the last few months. She has a history of anemia and has needed blood and iron infusions in the past. She would like her iron levels checked today.   She also endorses intermittent wavy lines in her vision. Last time this occurred was 3 months ago. Sometimes she gets headaches with this. She sees an eye doctor and has not been told she had any trouble with her eyes. She was told from a prior doctor that this could be from migraines. The symptoms don't last long and is not experiencing this currently.   Endorses left ear has constant ringing. She uses a sound machine at night to help her sleep. She endorses ear pressure off and on for the last few weeks. This is also associated with post nasal drip and rhinorrhea. She also endorses trouble hearing. She has a history of working in a warehouse.  She notes pain and discomfort on the ball of her left foot. Hard to describe the kind of pain. Moving/walking makes the pain worse, standing still doesn't hurt. She has tried different socks and shoes since she stands on her feet at work for 10.5 hours a day. Denies any skin changes, rashes, or bumps on her foot.   She has a history of osteoporosis.  She has been on 2 rounds of Fosamax, however this last round she did not tolerate as well and noticed stomach pain and irritation when taking it.  Her last DEXA scan was in 2021.  If she needs to continue on medication, she would like something different than Fosamax.   Past Medical History:   Diagnosis Date   Arthritis    Celiac disease    Heart murmur    IDA (iron deficiency anemia)    Kidney stone    Multiple pelvic fractures (HCC) 08/13/2014   OAB (overactive bladder)    Osteoporosis    Postmenopausal     Past Surgical History:  Procedure Laterality Date   CESAREAN SECTION     CHOLECYSTECTOMY      Family History  Problem Relation Age of Onset   Hypertension Mother    Heart disease Father    Arthritis Father    Kidney disease Neg Hx    Bladder Cancer Neg Hx     Social History   Socioeconomic History   Marital status: Married    Spouse name: Not on file   Number of children: Not on file   Years of education: Not on file   Highest education level: Not on file  Occupational History   Not on file  Tobacco Use   Smoking status: Never   Smokeless tobacco: Never  Vaping Use   Vaping Use: Never used  Substance and Sexual Activity   Alcohol use: No    Alcohol/week: 0.0 standard drinks   Drug use: No   Sexual activity: Never  Other Topics Concern   Not on file  Social History Narrative   Not on file   Social Determinants of Health  Financial Resource Strain: Not on file  Food Insecurity: Not on file  Transportation Needs: Not on file  Physical Activity: Not on file  Stress: Not on file  Social Connections: Not on file  Intimate Partner Violence: Not on file    ROS Review of Systems  Constitutional:  Positive for fatigue.  HENT:  Positive for congestion, ear pain (left ear pressure), postnasal drip and tinnitus. Negative for sore throat.   Eyes:  Positive for visual disturbance (wavy lines in vision this morning).  Respiratory: Negative.    Cardiovascular: Negative.   Gastrointestinal: Negative.   Endocrine: Negative.   Genitourinary: Negative.   Musculoskeletal:  Positive for arthralgias (bilateral knees when going up stairs).  Skin: Negative.   Neurological:  Positive for dizziness (occasional).  Psychiatric/Behavioral: Negative.      Objective:   Today's Vitals: BP 124/76    Pulse 68    Temp 97.7 F (36.5 C) (Temporal)    Ht 5\' 3"  (1.6 m)    Wt 129 lb 9.6 oz (58.8 kg)    LMP  (LMP Unknown)    SpO2 95%    BMI 22.96 kg/m   Physical Exam Vitals and nursing note reviewed.  Constitutional:      General: She is not in acute distress.    Appearance: Normal appearance.  HENT:     Head: Normocephalic and atraumatic.     Right Ear: Tympanic membrane, ear canal and external ear normal.     Left Ear: Ear canal and external ear normal. A middle ear effusion is present.     Nose: Nose normal.     Mouth/Throat:     Mouth: Mucous membranes are moist.     Pharynx: Oropharynx is clear.  Eyes:     Conjunctiva/sclera: Conjunctivae normal.     Pupils: Pupils are equal, round, and reactive to light.  Cardiovascular:     Rate and Rhythm: Normal rate and regular rhythm.     Pulses: Normal pulses.     Heart sounds: Normal heart sounds.  Pulmonary:     Effort: Pulmonary effort is normal.     Breath sounds: Normal breath sounds.  Abdominal:     General: Bowel sounds are normal.     Palpations: Abdomen is soft.     Tenderness: There is no abdominal tenderness.  Musculoskeletal:        General: Normal range of motion.     Cervical back: Normal range of motion and neck supple. No tenderness.     Right lower leg: No edema.     Left lower leg: No edema.     Comments: Strength 5/5 in bilateral upper and lower extremities   Lymphadenopathy:     Cervical: No cervical adenopathy.  Skin:    General: Skin is warm and dry.  Neurological:     General: No focal deficit present.     Mental Status: She is alert and oriented to person, place, and time.     Cranial Nerves: No cranial nerve deficit.     Coordination: Coordination normal.     Gait: Gait normal.  Psychiatric:        Mood and Affect: Mood normal.        Behavior: Behavior normal.        Thought Content: Thought content normal.        Judgment: Judgment normal.     Assessment & Plan:   Problem List Items Addressed This Visit       Musculoskeletal and  Integument   OP (osteoporosis) - Primary    Most recent DEXA on 08/20/2019 showed a T score of -4.1.  We will repeat DEXA.  She would like to hold off on Fosamax to try another option if needing to restart medication.  Will await new DEXA results and treat based on scores.  Encouraged her to start calcium and vitamin D supplement daily.      Relevant Medications   alendronate (FOSAMAX) 70 MG tablet   Other Relevant Orders   VITAMIN D 25 Hydroxy (Vit-D Deficiency, Fractures)     Other   Iron deficiency anemia (Chronic)    History of iron deficiency anemia requiring iron transfusion and blood transfusions.  With fatigue and sensitivity to cold we will check CBC and iron panel today.  She is up-to-date on colon cancer screening and is scheduled to return next year for next colonoscopy.      Relevant Orders   CBC   Iron, TIBC and Ferritin Panel (Completed)   Low serum vitamin D    Check vitamin D and treat based on results.      Relevant Orders   VITAMIN D 25 Hydroxy (Vit-D Deficiency, Fractures)   Pure hypercholesterolemia    Check lipid panel and treat based on results.      Relevant Orders   Comprehensive metabolic panel   Lipid panel   Tinnitus of left ear    Tinnitus ongoing for several years.  She uses a sound machine to sleep at night.  She would like a referral to ENT for further work-up.  Referral placed.      Relevant Orders   Ambulatory referral to ENT   Other Visit Diagnoses     Vitamin B12 deficiency       Check vitamin B12 today   Relevant Orders   Vitamin B12   Fatigue, unspecified type       Check CBC, iron panel, TSH today.  She does have a history of anemia.  We will treat based on results   Relevant Orders   CBC   Comprehensive metabolic panel   TSH   Iron, TIBC and Ferritin Panel (Completed)   Fluid level behind tympanic membrane of left ear       Most  likely related to allergies.  Continue Flonase daily.  Start Claritin or Zyrtec daily.  Trying to avoid prednisone with osteoporosis.   Left foot pain       While standing on feet and walking.  She has tried different socks and shoes.  No skin breakdown on exam.  Will refer to podiatry per patient request.   Relevant Orders   Ambulatory referral to Podiatry   Vision disturbance       Notes very intermittent wavy lines in vision.  Encouraged her to follow-up with eye doctor routinely.  She has already mentioned this to them in the past.       Outpatient Encounter Medications as of 09/22/2021  Medication Sig   Multiple Vitamins-Minerals (MULTIVITAMIN WITH MINERALS) tablet Take 1 tablet by mouth daily.   alendronate (FOSAMAX) 70 MG tablet Take by mouth. (Patient not taking: Reported on 09/22/2021)   calcium carbonate (OS-CAL) 600 MG TABS tablet Take 600 mg by mouth daily with breakfast. (Patient not taking: Reported on 09/22/2021)   ferrous sulfate 325 (65 FE) MG tablet Take 325 mg by mouth daily with breakfast.  (Patient not taking: Reported on 09/22/2021)   fluticasone (FLONASE) 50 MCG/ACT nasal spray Place 1 spray into both  nostrils 2 (two) times daily. (Patient not taking: Reported on 09/22/2021)   ibuprofen (ADVIL) 800 MG tablet ibuprofen 800 mg tablet (Patient not taking: Reported on 09/22/2021)   montelukast (SINGULAIR) 10 MG tablet montelukast 10 mg tablet  TAKE 1 TABLET BY MOUTH ONCE DAILY (Patient not taking: Reported on 09/22/2021)   [DISCONTINUED] amoxicillin (AMOXIL) 500 MG capsule amoxicillin 500 mg capsule (Patient not taking: Reported on 09/22/2021)   [DISCONTINUED] folic acid (FOLVITE) 800 MCG tablet Take 800 mcg by mouth daily. (Patient not taking: Reported on 09/22/2021)   [DISCONTINUED] methylPREDNISolone sodium succinate (SOLU-MEDROL) 125 mg/2 mL injection Solu-Medrol (PF) 125 mg/2 mL solution for injection  Take 125 mg by injection route. (Patient not taking: Reported on 09/22/2021)    [DISCONTINUED] oxyCODONE-acetaminophen (PERCOCET/ROXICET) 5-325 MG tablet oxycodone-acetaminophen 5 mg-325 mg tablet (Patient not taking: Reported on 09/22/2021)   [DISCONTINUED] vitamin B-12 (CYANOCOBALAMIN) 500 MCG tablet Take 500 mcg by mouth daily. (Patient not taking: Reported on 09/22/2021)   No facility-administered encounter medications on file as of 09/22/2021.    Follow-up: Return in about 6 months (around 03/25/2022) for osteoporosis.   Gerre Scull, NP

## 2021-09-23 ENCOUNTER — Encounter: Payer: Self-pay | Admitting: Nurse Practitioner

## 2021-09-23 DIAGNOSIS — H9312 Tinnitus, left ear: Secondary | ICD-10-CM | POA: Insufficient documentation

## 2021-09-23 LAB — VITAMIN D 25 HYDROXY (VIT D DEFICIENCY, FRACTURES): VITD: 35.96 ng/mL (ref 30.00–100.00)

## 2021-09-23 LAB — VITAMIN B12: Vitamin B-12: 375 pg/mL (ref 211–911)

## 2021-09-23 LAB — IRON,TIBC AND FERRITIN PANEL
%SAT: 6 % (calc) — ABNORMAL LOW (ref 16–45)
Ferritin: 4 ng/mL — ABNORMAL LOW (ref 16–288)
Iron: 27 ug/dL — ABNORMAL LOW (ref 45–160)
TIBC: 436 mcg/dL (calc) (ref 250–450)

## 2021-09-23 LAB — COMPREHENSIVE METABOLIC PANEL
ALT: 20 U/L (ref 0–35)
AST: 23 U/L (ref 0–37)
Albumin: 4 g/dL (ref 3.5–5.2)
Alkaline Phosphatase: 113 U/L (ref 39–117)
BUN: 18 mg/dL (ref 6–23)
CO2: 25 mEq/L (ref 19–32)
Calcium: 8.9 mg/dL (ref 8.4–10.5)
Chloride: 104 mEq/L (ref 96–112)
Creatinine, Ser: 0.58 mg/dL (ref 0.40–1.20)
GFR: 94.88 mL/min (ref >60.00)
Glucose, Bld: 98 mg/dL (ref 70–99)
Potassium: 3.8 mEq/L (ref 3.5–5.1)
Sodium: 138 mEq/L (ref 135–145)
Total Bilirubin: 0.2 mg/dL (ref 0.2–1.2)
Total Protein: 6.7 g/dL (ref 6.0–8.3)

## 2021-09-23 LAB — LIPID PANEL
Cholesterol: 221 mg/dL — ABNORMAL HIGH (ref 0–200)
HDL: 29.5 mg/dL — ABNORMAL LOW (ref >39.00)
LDL Cholesterol: 152 mg/dL — ABNORMAL HIGH (ref 0–99)
NonHDL: 191.97
Total CHOL/HDL Ratio: 8
Triglycerides: 200 mg/dL — ABNORMAL HIGH (ref 0.0–149.0)
VLDL: 40 mg/dL (ref 0.0–40.0)

## 2021-09-23 LAB — CBC
HCT: 32.3 % — ABNORMAL LOW (ref 36.0–46.0)
Hemoglobin: 10.6 g/dL — ABNORMAL LOW (ref 12.0–15.0)
MCHC: 32.8 g/dL (ref 30.0–36.0)
MCV: 80.7 fl (ref 78.0–100.0)
Platelets: 421 10*3/uL — ABNORMAL HIGH (ref 150.0–400.0)
RBC: 4 Mil/uL (ref 3.87–5.11)
RDW: 15.7 % — ABNORMAL HIGH (ref 11.5–15.5)
WBC: 5.2 10*3/uL (ref 4.0–10.5)

## 2021-09-23 LAB — TSH: TSH: 1.01 u[IU]/mL (ref 0.35–5.50)

## 2021-09-23 NOTE — Assessment & Plan Note (Signed)
Check lipid panel and treat based on results. ?

## 2021-09-23 NOTE — Assessment & Plan Note (Signed)
History of iron deficiency anemia requiring iron transfusion and blood transfusions.  With fatigue and sensitivity to cold we will check CBC and iron panel today.  She is up-to-date on colon cancer screening and is scheduled to return next year for next colonoscopy. ?

## 2021-09-23 NOTE — Assessment & Plan Note (Signed)
Check vitamin D and treat based on results.  ?

## 2021-09-23 NOTE — Assessment & Plan Note (Signed)
Most recent DEXA on 08/20/2019 showed a T score of -4.1.  We will repeat DEXA.  She would like to hold off on Fosamax to try another option if needing to restart medication.  Will await new DEXA results and treat based on scores.  Encouraged her to start calcium and vitamin D supplement daily. ?

## 2021-09-23 NOTE — Assessment & Plan Note (Signed)
Tinnitus ongoing for several years.  She uses a sound machine to sleep at night.  She would like a referral to ENT for further work-up.  Referral placed. ?

## 2021-09-24 ENCOUNTER — Encounter: Payer: Self-pay | Admitting: Nurse Practitioner

## 2021-09-27 ENCOUNTER — Encounter: Payer: Self-pay | Admitting: Oncology

## 2021-10-12 ENCOUNTER — Encounter: Payer: Self-pay | Admitting: Oncology

## 2021-10-25 ENCOUNTER — Encounter: Payer: Self-pay | Admitting: Nurse Practitioner

## 2021-10-26 ENCOUNTER — Ambulatory Visit (INDEPENDENT_AMBULATORY_CARE_PROVIDER_SITE_OTHER): Payer: BC Managed Care – PPO

## 2021-10-26 ENCOUNTER — Ambulatory Visit: Payer: BC Managed Care – PPO | Admitting: Podiatry

## 2021-10-26 DIAGNOSIS — B351 Tinea unguium: Secondary | ICD-10-CM

## 2021-10-26 DIAGNOSIS — M7741 Metatarsalgia, right foot: Secondary | ICD-10-CM | POA: Diagnosis not present

## 2021-10-26 DIAGNOSIS — M7742 Metatarsalgia, left foot: Secondary | ICD-10-CM

## 2021-10-26 DIAGNOSIS — G5762 Lesion of plantar nerve, left lower limb: Secondary | ICD-10-CM

## 2021-10-26 DIAGNOSIS — M79672 Pain in left foot: Secondary | ICD-10-CM

## 2021-10-26 DIAGNOSIS — M779 Enthesopathy, unspecified: Secondary | ICD-10-CM

## 2021-10-26 MED ORDER — MELOXICAM 7.5 MG PO TABS: 7.5000 mg | ORAL_TABLET | Freq: Every day | ORAL | 0 refills | Status: DC | PRN

## 2021-10-26 NOTE — Progress Notes (Signed)
SITUATION ?Reason for Consult: Evaluation for Bilateral Custom Foot Orthoses ?Patient / Caregiver Report: Patient is ready for foot orthotics ? ?OBJECTIVE DATA: ?Patient History / Diagnosis:  ?  ICD-10-CM   ?1. 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  ?2. Morton neuroma, left  G57.62   ?  ? ? ?Current or Previous Devices:   None ? ?Foot Examination: ?Skin presentation:   Intact ?Ulcers & Callousing:   None ?Toe / Foot Deformities:  Prominent met heads ?Weight Bearing Presentation:  Rectus ?Sensation:    Intact ? ?Shoe Size:    9.5W ? ?ORTHOTIC RECOMMENDATION ?Recommended Device: 1x pair of custom functional foot orthotics ? ?GOALS OF ORTHOSES ?- Reduce Pain ?- Prevent Foot Deformity ?- Prevent Progression of Further Foot Deformity ?- Relieve Pressure ?- Improve the Overall Biomechanical Function of the Foot and Lower Extremity. ? ?ACTIONS PERFORMED ?Potential out of pocket cost was communicated to patient. Patient understood and consent to casting. Patient was casted for Foot Orthoses via crush box. Procedure was explained and patient tolerated procedure well. Casts were shipped to central fabrication. All questions were answered and concerns addressed. ? ?PLAN ?Patient is to be called for fitting when devices are ready.  ? ? ?

## 2021-10-29 NOTE — Progress Notes (Signed)
Subjective:  ? ?Patient ID: Carla Cortez, female   DOB: 66 y.o.   MRN: [id number]  ? ?HPI ?66 year old female presents the office today for concerns of left foot pain in the ball of her foot.  She states this started about 10 years significantly worse over the last 3 months.  She gets an aching sensation.  She stands on concrete floors working 10 hours a day. She states that she has more difficulty moving the toes in the left foot compared to the right foot.  She is tried [employer].  She states that she wears different shoes and they do not last long.  She denies any recent injury.  She is also concerned about her right fourth toenail becoming thick. ? ? ?Review of Systems  ?All other systems reviewed and are negative. ? ?Past Medical History:  ?Diagnosis Date  ? Arthritis   ? Celiac disease   ? Heart murmur   ? IDA (iron deficiency anemia)   ? Kidney stone   ? Multiple pelvic fractures (HCC) 08/13/2014  ? OAB (overactive bladder)   ? Osteoporosis   ? Postmenopausal   ? ? ?Past Surgical History:  ?Procedure Laterality Date  ? CESAREAN SECTION    ? CHOLECYSTECTOMY    ? ? ? ?Current Outpatient Medications:  ?  meloxicam (MOBIC) 7.5 MG tablet, Take 1 tablet (7.5 mg total) by mouth daily as needed for pain., Disp: 30 tablet, Rfl: 0 ?  alendronate (FOSAMAX) 70 MG tablet, Take by mouth. (Patient not taking: Reported on 09/22/2021), Disp: , Rfl:  ?  calcium carbonate (OS-CAL) 600 MG TABS tablet, Take 600 mg by mouth daily with breakfast. (Patient not taking: Reported on 09/22/2021), Disp: , Rfl:  ?  ferrous sulfate 325 (65 FE) MG tablet, Take 325 mg by mouth daily with breakfast.  (Patient not taking: Reported on 09/22/2021), Disp: , Rfl:  ?  fluticasone (FLONASE) 50 MCG/ACT nasal spray, Place 1 spray into both nostrils 2 (two) times daily. (Patient not taking: Reported on 09/22/2021), Disp: , Rfl:  ?  ibuprofen (ADVIL) 800 MG tablet, ibuprofen 800 mg tablet (Patient not taking: Reported on 09/22/2021), Disp: , Rfl:  ?  montelukast  (SINGULAIR) 10 MG tablet, montelukast 10 mg tablet  TAKE 1 TABLET BY MOUTH ONCE DAILY (Patient not taking: Reported on 09/22/2021), Disp: , Rfl:  ?  Multiple Vitamins-Minerals (MULTIVITAMIN WITH MINERALS) tablet, Take 1 tablet by mouth daily., Disp: , Rfl:  ? ?Allergies  ?Allergen Reactions  ? Gluten Meal Nausea And Vomiting  ? Wheat Bran Nausea And Vomiting  ? ? ? ? ? ?   ?Objective:  ?Physical Exam  ?General: AAO x3, NAD ? ?Dermatological: Right fourth nails hypertrophic, dystrophic with mild discoloration.  There is no edema, erythema, drainage or pus or signs of infection.  No pain.  No open lesions. ? ?Vascular: Dorsalis Pedis artery and Posterior Tibial artery pedal pulses are 2/4 bilateral with immedate capillary fill time. There is no pain with calf compression, swelling, warmth, erythema.  ? ?Neruologic: Grossly intact via light touch bilateral.  Negative sign. ? ?Musculoskeletal:.  Tenderness is localized to the ball of her foot on the left side worse than the right.  There is prominent metatarsal heads plantarly with x-rays at that time.  There is no specific area of pinpoint tenderness noted today.  There is no edema, erythema.  Small palpable neuroma versus bursa along the third interspace of the left foot.  Muscular strength 5/5 in all groups tested bilateral. ? ?  Gait: Unassisted, Nonantalgic.  ? ? ?   ?Assessment:  ? ?66 year old female with metatarsalgia, neuroma; onychomycosis ? ?   ?Plan:  ?-Treatment options discussed including all alternatives, risks, and complications ?-Etiology of symptoms were discussed ?-X-rays were obtained and reviewed with the patient.  3 views of the left shoulder obtained.  Osteopenia present.  No evidence of acute fracture present. ?-Prescribed mobic. Discussed side effects of the medication and directed to stop if any are to occur and call the office.  ?-We discussed some exercises for the toes as well as shoes and arch support.  Also she was advised by our orthotist,  Arlys John for orthotics. ?-Consider steroid injection if needed ?-I ordered a compound cream through [place] for onychomycosis ? ?Vivi Barrack DPM ? ?   ? ?

## 2021-11-23 ENCOUNTER — Other Ambulatory Visit: Payer: Self-pay | Admitting: Podiatry

## 2021-11-23 DIAGNOSIS — M7742 Metatarsalgia, left foot: Secondary | ICD-10-CM

## 2021-12-03 ENCOUNTER — Encounter: Payer: Self-pay | Admitting: Podiatry

## 2021-12-16 ENCOUNTER — Ambulatory Visit: Payer: BC Managed Care – PPO

## 2021-12-16 DIAGNOSIS — G5762 Lesion of plantar nerve, left lower limb: Secondary | ICD-10-CM

## 2021-12-16 DIAGNOSIS — M7742 Metatarsalgia, left foot: Secondary | ICD-10-CM

## 2021-12-16 NOTE — Progress Notes (Signed)
SITUATION: Reason for Visit: Fitting and Delivery of Custom Fabricated Foot Orthoses Patient Report: Patient reports comfort and is satisfied with device.  OBJECTIVE DATA: Patient History / Diagnosis:     ICD-10-CM   1. Metatarsalgia, left foot  M77.42     2. Morton neuroma, left  G57.62       Provided Device:  Custom Functional Foot Orthotics     [employer]: [id number]  GOAL OF ORTHOSIS - Improve gait - Decrease energy expenditure - Improve Balance - Provide Triplanar stability of foot complex - Facilitate motion  ACTIONS PERFORMED Patient was fit with foot orthotics trimmed to shoe last. Patient tolerated fittign procedure.   Patient was provided with verbal and written instruction and demonstration regarding donning, doffing, wear, care, proper fit, function, purpose, cleaning, and use of the orthosis and in all related precautions and risks and benefits regarding the orthosis.  Patient was also provided with verbal instruction regarding how to report any failures or malfunctions of the orthosis and necessary follow up care. Patient was also instructed to contact our office regarding any change in status that may affect the function of the orthosis.  Patient demonstrated independence with proper donning, doffing, and fit and verbalized understanding of all instructions.  PLAN: Patient is to follow up in one week or as necessary (PRN). All questions were answered and concerns addressed. Plan of care was discussed with and agreed upon by the patient.

## 2021-12-29 DIAGNOSIS — H90A21 Sensorineural hearing loss, unilateral, right ear, with restricted hearing on the contralateral side: Secondary | ICD-10-CM | POA: Diagnosis not present

## 2021-12-29 DIAGNOSIS — H90A32 Mixed conductive and sensorineural hearing loss, unilateral, left ear with restricted hearing on the contralateral side: Secondary | ICD-10-CM | POA: Diagnosis not present

## 2021-12-29 DIAGNOSIS — H903 Sensorineural hearing loss, bilateral: Secondary | ICD-10-CM | POA: Diagnosis not present

## 2021-12-29 DIAGNOSIS — H9313 Tinnitus, bilateral: Secondary | ICD-10-CM | POA: Diagnosis not present

## 2021-12-29 DIAGNOSIS — H9312 Tinnitus, left ear: Secondary | ICD-10-CM | POA: Diagnosis not present

## 2021-12-30 ENCOUNTER — Other Ambulatory Visit: Payer: Self-pay | Admitting: Physician Assistant

## 2021-12-30 DIAGNOSIS — H903 Sensorineural hearing loss, bilateral: Secondary | ICD-10-CM

## 2022-06-20 ENCOUNTER — Ambulatory Visit (INDEPENDENT_AMBULATORY_CARE_PROVIDER_SITE_OTHER): Payer: BC Managed Care – PPO

## 2022-06-20 ENCOUNTER — Encounter: Payer: Self-pay | Admitting: Oncology

## 2022-06-20 ENCOUNTER — Ambulatory Visit
Admission: RE | Admit: 2022-06-20 | Discharge: 2022-06-20 | Disposition: A | Discharge status: Home / Self Care | Payer: BC Managed Care – PPO | Source: Ambulatory Visit | Attending: Internal Medicine | Admitting: Internal Medicine

## 2022-06-20 VITALS — BP 147/84 | HR 91 | Temp 98.5°F | Resp 20

## 2022-06-20 DIAGNOSIS — R0989 Other specified symptoms and signs involving the circulatory and respiratory systems: Secondary | ICD-10-CM

## 2022-06-20 DIAGNOSIS — R051 Acute cough: Secondary | ICD-10-CM | POA: Diagnosis not present

## 2022-06-20 DIAGNOSIS — R509 Fever, unspecified: Secondary | ICD-10-CM | POA: Diagnosis not present

## 2022-06-20 DIAGNOSIS — B349 Viral infection, unspecified: Secondary | ICD-10-CM

## 2022-06-20 MED ORDER — FLUTICASONE PROPIONATE 50 MCG/ACT NA SUSP: 1.0000 | Freq: Every day | NASAL | 0 refills | Status: DC

## 2022-06-20 MED ORDER — ALBUTEROL SULFATE HFA 108 (90 BASE) MCG/ACT IN AERS: 1.0000 | INHALATION_SPRAY | Freq: Four times a day (QID) | RESPIRATORY_TRACT | 0 refills | Status: DC | PRN

## 2022-06-20 MED ORDER — BENZONATATE 100 MG PO CAPS: 100.0000 mg | ORAL_CAPSULE | Freq: Three times a day (TID) | ORAL | 0 refills | Status: DC | PRN

## 2022-06-20 NOTE — ED Triage Notes (Signed)
Pt is present today with fever, HA, and scratchy throat. Pt sx started yesterday

## 2022-06-20 NOTE — ED Provider Notes (Addendum)
[facility]    CSN: [id number] Arrival date & time: 06/20/22  1239      History   Chief Complaint Chief Complaint  Patient presents with   Fever    Scratchy throat cough - Entered by patient   Headache    HPI Carla Cortez is a 66 y.o. female.   Patient presents with cough, nasal congestion, scratchy throat that started about 2.5 weeks ago.  Patient reports that symptoms stopped for a few days, and then returned yesterday.  She had new onset fever yesterday that was 101.  She has taken several over-the-counter medications including Mucinex, DayQuil, Tylenol with minimal improvement in symptoms.  Patient denies history of asthma or COPD and has never smoked cigarettes.  Denies chest pain, shortness of breath, sore throat, ear pain, nausea, vomiting, diarrhea, abdominal pain.   Fever Headache   Past Medical History:  Diagnosis Date   Arthritis    Celiac disease    Heart murmur    IDA (iron deficiency anemia)    Kidney stone    Multiple pelvic fractures (HCC) 08/13/2014   OAB (overactive bladder)    Osteoporosis    Postmenopausal     Patient Active Problem List   Diagnosis Date Noted   Tinnitus of left ear 09/23/2021   OAB (overactive bladder) 09/23/2015   Low serum vitamin D 08/18/2015   OP (osteoporosis) 08/18/2015   Pure hypercholesterolemia 08/18/2015   Celiac disease 08/14/2014   Iron deficiency anemia 08/14/2014    Past Surgical History:  Procedure Laterality Date   CESAREAN SECTION     CHOLECYSTECTOMY      OB History   No obstetric history on file.      Home Medications    Prior to Admission medications   Medication Sig Start Date End Date Taking? Authorizing Provider  albuterol (VENTOLIN HFA) 108 (90 Base) MCG/ACT inhaler Inhale 1-2 puffs into the lungs every 6 (six) hours as needed for wheezing or shortness of breath. 06/20/22  Yes Tiwan Schnitker, Rolly Salter E, FNP  benzonatate (TESSALON) 100 MG capsule Take 1 capsule (100 mg total) by mouth  every 8 (eight) hours as needed for cough. 06/20/22  Yes [employer] [place]well, Rolly Salter E, FNP  fluticasone (FLONASE) 50 MCG/ACT nasal spray Place 1 spray into both nostrils daily. 06/20/22  Yes Florentino Laabs, Acie Fredrickson, FNP  alendronate (FOSAMAX) 70 MG tablet Take by mouth. Patient not taking: Reported on 09/22/2021 10/27/17   [provider]  calcium carbonate (OS-CAL) 600 MG TABS tablet Take 600 mg by mouth daily with breakfast. Patient not taking: Reported on 09/22/2021    [provider]  ferrous sulfate 325 (65 FE) MG tablet Take 325 mg by mouth daily with breakfast.  Patient not taking: Reported on 09/22/2021    [provider]  ibuprofen (ADVIL) 800 MG tablet ibuprofen 800 mg tablet Patient not taking: Reported on 09/22/2021    [provider]  meloxicam (MOBIC) 7.5 MG tablet Take 1 tablet (7.5 mg total) by mouth daily as needed for pain. 10/26/21   Vivi Barrack, DPM  montelukast (SINGULAIR) 10 MG tablet montelukast 10 mg tablet  TAKE 1 TABLET BY MOUTH ONCE DAILY Patient not taking: Reported on 09/22/2021    [provider]  Multiple Vitamins-Minerals (MULTIVITAMIN WITH MINERALS) tablet Take 1 tablet by mouth daily.    [provider]    Family History Family History  Problem Relation Age of Onset   Hypertension Mother    Heart disease Father    Arthritis  Father    Kidney disease Neg Hx    Bladder Cancer Neg Hx     Social History Social History   Tobacco Use   Smoking status: Never   Smokeless tobacco: Never  Vaping Use   Vaping Use: Never used  Substance Use Topics   Alcohol use: No    Alcohol/week: 0.0 standard drinks of alcohol   Drug use: No     Allergies   Gluten meal and Wheat bran   Review of Systems Review of Systems Per HPI  Physical Exam Triage Vital Signs ED Triage Vitals  Enc Vitals Group     BP 06/20/22 1321 (!) 147/84     Pulse Rate 06/20/22 1321 91     Resp 06/20/22 1321 20     Temp 06/20/22 1321 98.5 F (36.9 C)      Temp src --      SpO2 06/20/22 1321 93 %     Weight --      Height --      Head Circumference --      Peak Flow --      Pain Score 06/20/22 1320 8     Pain Loc --      Pain Edu? --      Excl. in [place]? --    No data found.  Updated Vital Signs BP (!) 147/84   Pulse 91   Temp 98.5 F (36.9 C)   Resp 20   LMP  (LMP Unknown)   SpO2 97%   Visual Acuity Right Eye Distance:   Left Eye Distance:   Bilateral Distance:    Right Eye Near:   Left Eye Near:    Bilateral Near:     Physical Exam Constitutional:      General: She is not in acute distress.    Appearance: Normal appearance. She is not toxic-appearing or diaphoretic.  HENT:     Head: Normocephalic and atraumatic.     Right Ear: Tympanic membrane and ear canal normal.     Left Ear: Tympanic membrane and ear canal normal.     Nose: Congestion present.     Mouth/Throat:     Mouth: Mucous membranes are moist.     Pharynx: No posterior oropharyngeal erythema.  Eyes:     Extraocular Movements: Extraocular movements intact.     Conjunctiva/sclera: Conjunctivae normal.     Pupils: Pupils are equal, round, and reactive to light.  Cardiovascular:     Rate and Rhythm: Normal rate and regular rhythm.     Pulses: Normal pulses.     Heart sounds: Normal heart sounds.  Pulmonary:     Effort: Pulmonary effort is normal. No respiratory distress.     Breath sounds: Normal breath sounds. No stridor. No wheezing, rhonchi or rales.  Abdominal:     General: Abdomen is flat. Bowel sounds are normal.     Palpations: Abdomen is soft.  Musculoskeletal:        General: Normal range of motion.     Cervical back: Normal range of motion.  Skin:    General: Skin is warm and dry.  Neurological:     General: No focal deficit present.     Mental Status: She is alert and oriented to person, place, and time. Mental status is at baseline.  Psychiatric:        Mood and Affect: Mood normal.        Behavior: Behavior normal.      UC  Treatments /  Results  Labs (all labs ordered are listed, but only abnormal results are displayed) Labs Reviewed - No data to display  EKG   Radiology DG Chest 2 View  Result Date: 06/20/2022 CLINICAL DATA:  Fever, headache and scratchy throat. EXAM: CHEST - 2 VIEW COMPARISON:  None Available. FINDINGS: Cardiac silhouette is normal in size and configuration. Normal mediastinal and hilar contours. Minor linear scarring or atelectasis at the anterior lung bases, seen on the lateral view. Lungs otherwise clear. No pleural effusion or pneumothorax. Skeletal structures are intact. IMPRESSION: No active cardiopulmonary disease. Electronically Signed   By: Lajean Manes M.D.   On: 06/20/2022 14:02    Procedures Procedures (including critical care time)  Medications Ordered in UC Medications - No data to display  Initial Impression / Assessment and Plan / UC Course  I have reviewed the triage vital signs and the nursing notes.  Pertinent labs & imaging results that were available during my care of the patient were reviewed by me and considered in my medical decision making (see chart for details).     Chest x-ray performed given duration of symptoms and concern for secondary bacterial infection.  It was negative for any obvious cardiopulmonary process.  It does show scarring versus atelectasis in anterior lung bases.  Given concern for atelectasis, will treat with albuterol inhaler to open up lungs.  Also prescribed patient medications to help alleviate symptoms given that it appears viral in nature on physical exam.  Do not think viral testing is necessary given duration of symptoms as it would not change treatment.  Chest x-ray was negative and given concern for viral illness, do not think antibiotic therapy is necessary.  Although, patient was advised to follow-up if symptoms persist or worsen.  Also advised patient that it may be best to have repeat chest x-ray with PCP or urgent care in the  next few weeks.  Oxygen was normal and stable so do not think that emergent evaluation is necessary.  Patient verbalized understanding and was agreeable with plan. Final Clinical Impressions(s) / UC Diagnoses   Final diagnoses:  Viral illness  Acute cough     Discharge Instructions      Your chest x-ray was normal.  It appears that you have a viral illness.  I have prescribed you 3 different medications to help alleviate symptoms.  Please follow-up if symptoms persist or worsen.     ED Prescriptions     Medication Sig Dispense Auth. Provider   albuterol (VENTOLIN HFA) 108 (90 Base) MCG/ACT inhaler Inhale 1-2 puffs into the lungs every 6 (six) hours as needed for wheezing or shortness of breath. 1 each [place], [place] E, [place]   benzonatate (TESSALON) 100 MG capsule Take 1 capsule (100 mg total) by mouth every 8 (eight) hours as needed for cough. 21 capsule [place], [place] E, [place]   fluticasone [facility]) 50 MCG/ACT nasal spray Place 1 spray into both nostrils daily. 16 g Teodora Medici, [place]      PDMP not reviewed this encounter.   Teodora Medici, [place] 06/20/22 [place], [place], [place] 06/20/22 1432

## 2022-06-20 NOTE — Discharge Instructions (Signed)
Your chest x-ray was normal.  It appears that you have a viral illness.  I have prescribed you 3 different medications to help alleviate symptoms.  Please follow-up if symptoms persist or worsen.

## 2023-08-08 ENCOUNTER — Ambulatory Visit: Payer: BC Managed Care – PPO | Admitting: Nurse Practitioner

## 2023-08-08 VITALS — BP 150/80 | HR 58 | Temp 97.0°F | Ht 63.0 in | Wt 133.8 lb

## 2023-08-08 DIAGNOSIS — R030 Elevated blood-pressure reading, without diagnosis of hypertension: Secondary | ICD-10-CM | POA: Diagnosis not present

## 2023-08-08 DIAGNOSIS — R040 Epistaxis: Secondary | ICD-10-CM

## 2023-08-08 DIAGNOSIS — D508 Other iron deficiency anemias: Secondary | ICD-10-CM | POA: Diagnosis not present

## 2023-08-08 DIAGNOSIS — R7989 Other specified abnormal findings of blood chemistry: Secondary | ICD-10-CM

## 2023-08-08 DIAGNOSIS — E7800 Pure hypercholesterolemia, unspecified: Secondary | ICD-10-CM

## 2023-08-08 DIAGNOSIS — M25542 Pain in joints of left hand: Secondary | ICD-10-CM | POA: Insufficient documentation

## 2023-08-08 DIAGNOSIS — M810 Age-related osteoporosis without current pathological fracture: Secondary | ICD-10-CM | POA: Diagnosis not present

## 2023-08-08 DIAGNOSIS — G5621 Lesion of ulnar nerve, right upper limb: Secondary | ICD-10-CM

## 2023-08-08 DIAGNOSIS — Z1231 Encounter for screening mammogram for malignant neoplasm of breast: Secondary | ICD-10-CM

## 2023-08-08 LAB — CBC WITH DIFFERENTIAL/PLATELET
Basophils Absolute: 0 10*3/uL (ref 0.0–0.1)
Basophils Relative: 0.8 % (ref 0.0–3.0)
Eosinophils Absolute: 0.3 10*3/uL (ref 0.0–0.7)
Eosinophils Relative: 5.4 % — ABNORMAL HIGH (ref 0.0–5.0)
HCT: 37.9 % (ref 36.0–46.0)
Hemoglobin: 12.2 g/dL (ref 12.0–15.0)
Lymphocytes Relative: 24.6 % (ref 12.0–46.0)
Lymphs Abs: 1.2 10*3/uL (ref 0.7–4.0)
MCHC: 32.3 g/dL (ref 30.0–36.0)
MCV: 82.3 fL (ref 78.0–100.0)
Monocytes Absolute: 0.4 10*3/uL (ref 0.1–1.0)
Monocytes Relative: 8.2 % (ref 3.0–12.0)
Neutro Abs: 3 10*3/uL (ref 1.4–7.7)
Neutrophils Relative %: 61 % (ref 43.0–77.0)
Platelets: 388 10*3/uL (ref 150.0–400.0)
RBC: 4.61 Mil/uL (ref 3.87–5.11)
RDW: 16.6 % — ABNORMAL HIGH (ref 11.5–15.5)
WBC: 4.9 10*3/uL (ref 4.0–10.5)

## 2023-08-08 LAB — COMPREHENSIVE METABOLIC PANEL
ALT: 20 U/L (ref 0–35)
AST: 24 U/L (ref 0–37)
Albumin: 4.3 g/dL (ref 3.5–5.2)
Alkaline Phosphatase: 135 U/L — ABNORMAL HIGH (ref 39–117)
BUN: 13 mg/dL (ref 6–23)
CO2: 28 meq/L (ref 19–32)
Calcium: 9.3 mg/dL (ref 8.4–10.5)
Chloride: 104 meq/L (ref 96–112)
Creatinine, Ser: 0.58 mg/dL (ref 0.40–1.20)
GFR: 93.64 mL/min (ref >60.00)
Glucose, Bld: 91 mg/dL (ref 70–99)
Potassium: 4 meq/L (ref 3.5–5.1)
Sodium: 140 meq/L (ref 135–145)
Total Bilirubin: 0.4 mg/dL (ref 0.2–1.2)
Total Protein: 7.2 g/dL (ref 6.0–8.3)

## 2023-08-08 LAB — VITAMIN D 25 HYDROXY (VIT D DEFICIENCY, FRACTURES): VITD: 26.83 ng/mL — ABNORMAL LOW (ref 30.00–100.00)

## 2023-08-08 LAB — LIPID PANEL
Cholesterol: 253 mg/dL — ABNORMAL HIGH (ref 0–200)
HDL: 39.3 mg/dL (ref >39.00)
LDL Cholesterol: 180 mg/dL — ABNORMAL HIGH (ref 0–99)
NonHDL: 213.96
Total CHOL/HDL Ratio: 6
Triglycerides: 171 mg/dL — ABNORMAL HIGH (ref 0.0–149.0)
VLDL: 34.2 mg/dL (ref 0.0–40.0)

## 2023-08-08 LAB — TSH: TSH: 1.17 u[IU]/mL (ref 0.35–5.50)

## 2023-08-08 NOTE — Progress Notes (Signed)
 Acute Office Visit  Subjective:     Patient ID: Carla Cortez, female    DOB: October 06, 1955, 68 y.o.   MRN: [id number]  Chief Complaint  Patient presents with   Arm Pain    Left arm and elbow pain for 1 month    HPI Patient is in today for left arm and elbow pain for 1 month.  Discussed the use of AI scribe software for clinical note transcription with the patient, who gave verbal consent to proceed.  History of Present Illness   The patient presents with multiple complaints. The patient reports experiencing a feeling of fogginess, which they suspect may be related to their elevated blood pressure. They deny any previous history of hypertension and express surprise at the recent high reading. She also denies chest pain and shortness of breath.   The patient also reports persistent pain and weakness in the left arm and hand, which began about a month ago. The pain in the arm has since resolved, but the hand pain and weakness persist, particularly affecting grip strength. The patient notes that their fingers hurt constantly, especially when bending them. They also mention occasional sharp, electrical shock-like pain in the feet and back.  In addition to these issues, the patient is experiencing sinus problems, which they attribute to the onset of winter and the use of heating. They report one-sided nosebleeds and have been taking sinus medication to manage the symptoms. She denies fevers, cough, ear pain, and sore throat.   The patient also mentions a recent surgery on the right elbow due to nerve issues, which has resulted in ongoing pain in the elbow and pinky finger. They express concern about the possibility of needing another surgery. 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 She has been taking tylenol  as needed at bedtime.     ROS See pertinent positives and negatives per HPI.     Objective:    BP (!) 150/80 (BP Location: Left Arm, Cuff Size: Normal)    Pulse (!) 58   Temp (!) 97 F (36.1 C)   Ht 5' 3 (1.6 m)   Wt 133 lb 12.8 oz (60.7 kg)   LMP  (LMP Unknown)   SpO2 96%   BMI 23.70 kg/m    Physical Exam Vitals and nursing note reviewed.  Constitutional:      General: She is not in acute distress.    Appearance: Normal appearance.  HENT:     Head: Normocephalic.  Eyes:     Conjunctiva/sclera: Conjunctivae normal.  Cardiovascular:     Rate and Rhythm: Normal rate and regular rhythm.     Pulses: Normal pulses.     Heart sounds: Normal heart sounds.  Pulmonary:     Effort: Pulmonary effort is normal.     Breath sounds: Normal breath sounds.  Musculoskeletal:        General: No swelling or tenderness. Normal range of motion.     Cervical back: Normal range of motion.     Comments: Empty can test, left arm slightly weaker than right  Skin:    General: Skin is warm.  Neurological:     General: No focal deficit present.     Mental Status: She is alert and oriented to person, place, and time.  Psychiatric:        Mood and Affect: Mood normal.        Behavior: Behavior normal.        Thought Content: Thought content  normal.        Judgment: Judgment normal.      Assessment & Plan:   Problem List Items Addressed This Visit       Nervous and Auditory   Cubital tunnel syndrome on right   History of right elbow surgery for nerve issues with ongoing pain in the right elbow and pinky finger, and intermittent electrical shock pain in feet and back. Advise keeping arms straight while sleeping to reduce nerve irritation and continue Tylenol  as needed for pain.         Musculoskeletal and Integument   OP (osteoporosis)   History of osteoporosis, previously on Fosamax but discontinued due to side effects. Due for a DEXA scan. Order the scan and provide contact information to schedule. If still showing osteoporosis or elevated risk of fractures, will start prolia.       Relevant Orders   DG Bone Density     Other   Iron   deficiency anemia (Chronic)   History of iron  deficiency with intermittent iron  supplement use due to stomach upset. Reports feeling foggy, possibly related. Continue iron  supplements as tolerated and order labs to check iron  panel and CBC.      Relevant Orders   CBC with Differential/Platelet   Iron , TIBC and Ferritin Panel   Low serum vitamin D    Check vitamin D  and treat based on results.      Relevant Orders   VITAMIN D  25 Hydroxy (Vit-D Deficiency, Fractures)   Pure hypercholesterolemia   Check lipid panel and treat based on results.      Relevant Orders   CBC with Differential/Platelet   Comprehensive metabolic panel   Lipid panel   Arthralgia of left hand - Primary   Chronic hand pain and stiffness, likely osteoarthritis due to chronicity and absence of morning stiffness. No swelling observed. Continue Tylenol  as needed and recommend Voltaren gel up to four times daily for pain relief.      Elevated blood pressure reading   Blood pressure readings are newly elevated with no prior history of hypertension. Reports feeling foggy, possibly related to the elevated readings. No symptoms of hypertensive urgency or emergency. Advise monitoring blood pressure at home three times a week and encourage a low salt diet. Plan to recheck blood pressure in two months and consider antihypertensive medication if still elevated.      Relevant Orders   TSH   Bleeding nose   Chronic sinus issues worsen in winter with heating, accompanied by one-sided nosebleeds. Recommend using saline nasal spray three times daily or more to alleviate dryness and prevent nosebleeds.      Other Visit Diagnoses       Encounter for screening mammogram for malignant neoplasm of breast       Mammogram ordered today   Relevant Orders   MM 3D SCREENING MAMMOGRAM BILATERAL BREAST       No orders of the defined types were placed in this encounter.   Return in about 2 months (around 10/06/2023) for  CPE.  Tinnie DELENA Harada, NP

## 2023-08-08 NOTE — Assessment & Plan Note (Signed)
 History of right elbow surgery for nerve issues with ongoing pain in the right elbow and pinky finger, and intermittent electrical shock pain in feet and back. Advise keeping arms straight while sleeping to reduce nerve irritation and continue Tylenol  as needed for pain.

## 2023-08-08 NOTE — Assessment & Plan Note (Signed)
 Chronic sinus issues worsen in winter with heating, accompanied by one-sided nosebleeds. Recommend using saline nasal spray three times daily or more to alleviate dryness and prevent nosebleeds.

## 2023-08-08 NOTE — Assessment & Plan Note (Signed)
Check vitamin D and treat based on results.  ?

## 2023-08-08 NOTE — Assessment & Plan Note (Signed)
 History of iron deficiency with intermittent iron supplement use due to stomach upset. Reports feeling "foggy," possibly related. Continue iron supplements as tolerated and order labs to check iron panel and CBC.

## 2023-08-08 NOTE — Assessment & Plan Note (Signed)
 Blood pressure readings are newly elevated with no prior history of hypertension. Reports feeling foggy, possibly related to the elevated readings. No symptoms of hypertensive urgency or emergency. Advise monitoring blood pressure at home three times a week and encourage a low salt diet. Plan to recheck blood pressure in two months and consider antihypertensive medication if still elevated.

## 2023-08-08 NOTE — Assessment & Plan Note (Signed)
 Chronic hand pain and stiffness, likely osteoarthritis due to chronicity and absence of morning stiffness. No swelling observed. Continue Tylenol as needed and recommend Voltaren gel up to four times daily for pain relief.

## 2023-08-08 NOTE — Assessment & Plan Note (Signed)
 History of osteoporosis, previously on Fosamax but discontinued due to side effects. Due for a DEXA scan. Order the scan and provide contact information to schedule. If still showing osteoporosis or elevated risk of fractures, will start prolia.

## 2023-08-08 NOTE — Assessment & Plan Note (Signed)
Check lipid panel and treat based on results.

## 2023-08-08 NOTE — Patient Instructions (Addendum)
 It was great to see you!  Start checking your blood pressure at home daily   Keep taking tylenol  as needed for pain   You can use voltaren gel on your hands/joints up to 4 times a day to help with pain   You can start saline nasal spray 3 times a day (or more frequently to help with the sinuses and nose bleeds   I have placed an order for screening mammogram & Dexa scan, they should call you to schedule. If you do not hear from them in the next week, please call:  [facility] [address] [place], [address] [place], [place] [phone]   Let's follow-up in 2 months, sooner if you have concerns.  If a referral was placed today, you will be contacted for an appointment. Please note that routine referrals can sometimes take up to 3-4 weeks to process. Please call our office if you haven't heard anything after this time frame.  Take care,  Tinnie Harada, NP

## 2023-08-09 LAB — IRON,TIBC AND FERRITIN PANEL
%SAT: 17 % (ref 16–45)
Ferritin: 7 ng/mL — ABNORMAL LOW (ref 16–288)
Iron: 81 ug/dL (ref 45–160)
TIBC: 463 ug/dL — ABNORMAL HIGH (ref 250–450)

## 2023-08-10 ENCOUNTER — Encounter: Payer: Self-pay | Admitting: Nurse Practitioner

## 2023-08-10 MED ORDER — ROSUVASTATIN CALCIUM 10 MG PO TABS: 10.0000 mg | ORAL_TABLET | Freq: Every day | ORAL | 0 refills | Status: DC

## 2023-08-10 NOTE — Addendum Note (Signed)
Addended by: Rodman Pickle A on: 08/10/2023 09:29 AM   Modules accepted: Orders

## 2023-08-30 MED ORDER — ROSUVASTATIN CALCIUM 10 MG PO TABS: 10.0000 mg | ORAL_TABLET | Freq: Every day | ORAL | 0 refills | Status: DC

## 2023-08-30 NOTE — Addendum Note (Signed)
 Addended by: Nailani Full A on: 08/30/2023 02:19 PM   Modules accepted: Orders

## 2023-09-05 ENCOUNTER — Inpatient Hospital Stay: Payer: BC Managed Care – PPO

## 2023-09-05 ENCOUNTER — Inpatient Hospital Stay: Payer: BC Managed Care – PPO | Attending: Nurse Practitioner | Admitting: Nurse Practitioner

## 2023-09-05 VITALS — BP 142/82 | HR 71 | Temp 97.8°F | Resp 18 | Ht 63.0 in | Wt 138.1 lb

## 2023-09-05 DIAGNOSIS — Z79899 Other long term (current) drug therapy: Secondary | ICD-10-CM | POA: Diagnosis not present

## 2023-09-05 DIAGNOSIS — M810 Age-related osteoporosis without current pathological fracture: Secondary | ICD-10-CM | POA: Diagnosis not present

## 2023-09-05 DIAGNOSIS — R5383 Other fatigue: Secondary | ICD-10-CM | POA: Insufficient documentation

## 2023-09-05 DIAGNOSIS — D509 Iron deficiency anemia, unspecified: Secondary | ICD-10-CM | POA: Diagnosis present

## 2023-09-05 DIAGNOSIS — K900 Celiac disease: Secondary | ICD-10-CM | POA: Insufficient documentation

## 2023-09-05 DIAGNOSIS — K5900 Constipation, unspecified: Secondary | ICD-10-CM | POA: Diagnosis not present

## 2023-09-05 DIAGNOSIS — D508 Other iron deficiency anemias: Secondary | ICD-10-CM | POA: Diagnosis not present

## 2023-09-05 DIAGNOSIS — M129 Arthropathy, unspecified: Secondary | ICD-10-CM | POA: Insufficient documentation

## 2023-09-05 DIAGNOSIS — R110 Nausea: Secondary | ICD-10-CM | POA: Insufficient documentation

## 2023-09-05 DIAGNOSIS — R011 Cardiac murmur, unspecified: Secondary | ICD-10-CM | POA: Diagnosis not present

## 2023-09-05 NOTE — Progress Notes (Signed)
 [facility]   [phone] [phone]   Clinic New consult Note   Patient Care Team: Gerre Scull, NP as PCP - General (Internal Medicine) 09/10/2023  CHIEF COMPLAINTS/PURPOSE OF CONSULTATION:  Iron deficiency anemia Anemia   HISTORY OF PRESENTING ILLNESS:  Carla Cortez 68 y.o. female is here because of iron deficiency anemia.  She was referred from [facility].  She reports a long history of iron deficiency.  First time she was diagnosed was approximately 20 years ago.  Had to have blood transfusion.  On 08/08/2023, her hemoglobin was 12.2, hematocrit 37.9.  Her iron was 81, TIBC 463, percent ratio 17, ferritin was 7.  She has been using oral iron intermittently.  Has had to discontinue use due to side effects which include nausea and constipation.  She had a colonoscopy in March 2021.  She had tubular adenoma, a sessile polyp, and hyperplastic polyp.  Repeat was expected in 2024. Socially, she moved to [place] approximately 10 years ago.  She lives with her significant other.  She has 1 daughter in [place] and 1 daughter here in [place].  She has history of hypertension and headaches which are generally well-managed.  She denies smoking or smoking history.  She states she drinks socially, but this is once every few months.  She denies use of recreational or nonprescribed medication. She presents to the clinic alone today.  Her main complaint is fatigue.She denies chest pain, chest pressure, or shortness of breath. She denies headaches or visual disturbances. She denies abdominal pain, nausea, vomiting, or changes in bowel or bladder habits.  She denies hematemesis, blood in her stool, hematuria or epistaxis.  She was found to have abnormal CBC from 08/08/2023. She denies recent chest pain on exertion, shortness of breath on minimal exertion, pre-syncopal episodes, or palpitations. She had not noticed any recent bleeding such as epistaxis,  hematuria or hematochezia The patient denies over the counter NSAID ingestion. She is not  on antiplatelets agents. Her last colonoscopy was 09/3019 She had no prior history or diagnosis of cancer. Her age appropriate screening programs are up-to-date. She denies any pica and eats a variety of diet. She never donated blood.  She has received a blood transfusion approximately 20 years ago.  She has also received multiple iron infusions are to been beneficial. It was recommended that the patient take oral iron supplements.  She will take these intermittently.   will have to stop taking them due to nausea and constipation.  REVIEW OF SYSTEMS:   Constitutional: Denies fevers, chills or abnormal night sweats. Fatigue.  Eyes: Denies blurriness of vision, double vision or watery eyes Ears, nose, mouth, throat, and face: Denies mucositis or sore throat Respiratory: Denies cough, dyspnea or wheezes Cardiovascular: Denies palpitation, chest discomfort or lower extremity swelling Gastrointestinal:  Denies nausea, heartburn or change in bowel habits Skin: Denies abnormal skin rashes Lymphatics: Denies new lymphadenopathy or easy bruising Neurological:Denies numbness, tingling or new weaknesses Behavioral/Psych: Mood is stable, no new changes   All other systems were reviewed with the patient and are negative.   MEDICAL HISTORY:  Past Medical History:  Diagnosis Date   Arthritis    Celiac disease    Heart murmur    IDA (iron deficiency anemia)    Kidney stone    Multiple pelvic fractures (HCC) 08/13/2014   OAB (overactive bladder)    Osteoporosis    Postmenopausal     SURGICAL HISTORY: Past Surgical History:  Procedure  Laterality Date   CESAREAN SECTION     CHOLECYSTECTOMY     ULNAR NERVE TRANSPOSITION Right     SOCIAL HISTORY: Social History   Socioeconomic History   Marital status: Single    Spouse name: Not on file   Number of children: Not on file   Years of education: Not on  file   Highest education level: Some college, no degree  Occupational History   Not on file  Tobacco Use   Smoking status: Never   Smokeless tobacco: Never  Vaping Use   Vaping status: Never Used  Substance and Sexual Activity   Alcohol use: No    Alcohol/week: 0.0 standard drinks of alcohol   Drug use: No   Sexual activity: Never  Other Topics Concern   Not on file  Social History Narrative   Not on file   Social Drivers of Health   Financial Resource Strain: Low Risk  (08/08/2023)   Overall Financial Resource Strain (CARDIA)    Difficulty of Paying Living Expenses: Not very hard  Food Insecurity: No Food Insecurity (09/05/2023)   Hunger Vital Sign    Worried About Running Out of Food in the Last Year: Never true    Ran Out of Food in the Last Year: Never true  Transportation Needs: No Transportation Needs (09/05/2023)   PRAPARE - Administrator, Civil Service (Medical): No    Lack of Transportation (Non-Medical): No  Physical Activity: Sufficiently Active (08/08/2023)   Exercise Vital Sign    Days of Exercise per Week: 5 days    Minutes of Exercise per Session: 150+ min  Stress: No Stress Concern Present (08/08/2023)   [employer] of Occupational Health - Occupational Stress Questionnaire    Feeling of Stress : Only a little  Social Connections: Moderately Isolated (08/08/2023)   Social Connection and Isolation Panel [NHANES]    Frequency of Communication with Friends and Family: More than three times a week    Frequency of Social Gatherings with Friends and Family: Once a week    Attends Religious Services: Never    Database administrator or Organizations: No    Attends Engineer, structural: Not on file    Marital Status: Living with partner  Intimate Partner Violence: Not At Risk (09/05/2023)   Humiliation, Afraid, Rape, and Kick questionnaire    Fear of Current or Ex-Partner: No    Emotionally Abused: No    Physically Abused: No    Sexually  Abused: No    FAMILY HISTORY: Family History  Problem Relation Age of Onset   Hypertension Mother    Heart disease Father    Arthritis Father    Kidney disease Neg Hx    Bladder Cancer Neg Hx     ALLERGIES:  is allergic to gluten meal and wheat.  MEDICATIONS:  Current Outpatient Medications  Medication Sig Dispense Refill   ferrous sulfate 325 (65 FE) MG tablet Take 325 mg by mouth daily with breakfast.  (Patient not taking: Reported on 08/08/2023)     ibuprofen (ADVIL) 800 MG tablet ibuprofen 800 mg tablet (Patient not taking: Reported on 08/08/2023)     Multiple Vitamins-Minerals (MULTIVITAMIN WITH MINERALS) tablet Take 1 tablet by mouth daily.     rosuvastatin (CRESTOR) 10 MG tablet Take 1 tablet (10 mg total) by mouth daily. 90 tablet 0   No current facility-administered medications for this visit.    PHYSICAL EXAMINATION: ECOG PERFORMANCE STATUS: 1 - Symptomatic but completely ambulatory  Today's Vitals   09/05/23 1418 09/05/23 1422 09/05/23 1433  BP: (!) 149/84 (!) 141/66 (!) 142/82  Pulse: 71    Resp: 18    Temp: 97.8 F (36.6 C)    TempSrc: Temporal    SpO2: 97%    Weight: 138 lb 1.6 oz (62.6 kg)    Height: 5\' 3"  (1.6 m)     Body mass index is 24.46 kg/m.   Filed Weights   09/05/23 1418  Weight: 138 lb 1.6 oz (62.6 kg)    GENERAL:alert, no distress and comfortable SKIN: skin color, texture, turgor are normal, no rashes or significant lesions EYES: normal, conjunctiva are pink and non-injected, sclera clear OROPHARYNX:no exudate, no erythema and lips, buccal mucosa, and tongue normal  NECK: supple, thyroid normal size, non-tender, without nodularity LYMPH:  no palpable lymphadenopathy in the cervical, axillary or inguinal LUNGS: clear to auscultation and percussion with normal breathing effort HEART: regular rate & rhythm and no murmurs and no lower extremity edema ABDOMEN:abdomen soft, non-tender and normal bowel sounds Musculoskeletal:no cyanosis of  digits and no clubbing  PSYCH: alert & oriented x 3 with fluent speech NEURO: no focal motor/sensory deficits  LABORATORY DATA:  I have reviewed the data as listed    Latest Ref Rng & Units 08/08/2023    9:29 AM 09/22/2021    3:13 PM 08/07/2017    2:08 PM  CBC  WBC 4.0 - 10.5 K/uL 4.9  5.2  6.0   Hemoglobin 12.0 - 15.0 g/dL 82.9  56.2  13.0   Hematocrit 36.0 - 46.0 % 37.9  32.3  40.3   Platelets 150.0 - 400.0 K/uL 388.0  421.0  396        Latest Ref Rng & Units 08/08/2023    9:29 AM 09/22/2021    3:13 PM 08/14/2014    3:06 AM  CMP  Glucose 70 - 99 mg/dL 91  98  865   BUN 6 - 23 mg/dL 13  18  11    Creatinine 0.40 - 1.20 mg/dL 7.84  6.96  2.95   Sodium 135 - 145 mEq/L 140  138  136   Potassium 3.5 - 5.1 mEq/L 4.0  3.8  4.7   Chloride 96 - 112 mEq/L 104  104  107   CO2 19 - 32 mEq/L 28  25  20    Calcium 8.4 - 10.5 mg/dL 9.3  8.9  8.5   Total Protein 6.0 - 8.3 g/dL 7.2  6.7    Total Bilirubin 0.2 - 1.2 mg/dL 0.4  0.2    Alkaline Phos 39 - 117 U/L 135  113    AST 0 - 37 U/L 24  23    ALT 0 - 35 U/L 20  20      Assessment and plan Other iron deficiency anemia Assessment & Plan: On 08/08/2023, her hemoglobin was 12.2, hematocrit 37.9.  Her iron was 81, TIBC 463, percent ratio 17, ferritin was 7.  She has been using oral iron intermittently.  Has had to discontinue use due to side effects which include nausea and constipation.  She had a colonoscopy in March 2021.  She had tubular adenoma, a sessile polyp, and hyperplastic polyp.  Repeat was expected in 2024. Will treat with IV iron, Venofer 250 mg.  Will give 2 treatments, each 2 weeks apart.  Total of 500 mg IV Venofer to be given.   Check labs every 4 months and treat with IV iron as needed. Follow-up in 1 year.  The patient was seen along with Dr. Mosetta Putt today. All questions were answered. The patient knows to call the clinic with any problems, questions or concerns. The total time spent in the appointment was 30 minutes.      Carlean Jews, NP 09/10/2023 8:02 PM    Addendum I have seen the patient, examined her. I agree with the assessment and and plan and have edited the notes.   68 yo female with PMH of celiac disease and iron deficient anemia, on oral iron intermittently due to some GI side effect.  During her recent checkup with her primary care physician, her labs showed iron deficiency but no anemia.  She does not have symptoms related to iron deficiency, including fatigue and feeling foggy.  Due to her poor iron absorption secondary to celiac disease, I recommend IV iron 500 mg in the next few weeks, to improve her symptoms.  Benefit and side effect discussed with her, especially possible allergy reactions.  She agrees to proceed.  Will monitor her lab periodically, follow-up in a year.  All questions were answered.  I spent a total of 30 minutes for her visit today, more than 50% time on face-to-face counseling.  Malachy Mood MD 09/05/2023

## 2023-09-10 NOTE — Assessment & Plan Note (Addendum)
 On 08/08/2023, her hemoglobin was 12.2, hematocrit 37.9.  Her iron was 81, TIBC 463, percent ratio 17, ferritin was 7.  She has been using oral iron intermittently.  Has had to discontinue use due to side effects which include nausea and constipation.  She had a colonoscopy in March 2021.  She had tubular adenoma, a sessile polyp, and hyperplastic polyp.  Repeat was expected in 2024. Will treat with IV iron, Venofer 250 mg.  Will give 2 treatments, each 2 weeks apart.  Total of 500 mg IV Venofer to be given.   Check labs every 4 months and treat with IV iron as needed. Follow-up in 1 year.

## 2023-09-11 ENCOUNTER — Encounter: Payer: Self-pay | Admitting: Oncology

## 2023-09-11 ENCOUNTER — Encounter: Payer: Self-pay | Admitting: Nurse Practitioner

## 2023-09-28 ENCOUNTER — Ambulatory Visit
Admission: RE | Admit: 2023-09-28 | Discharge: 2023-09-28 | Disposition: A | Discharge status: Home / Self Care | Payer: BC Managed Care – PPO | Source: Ambulatory Visit | Attending: Nurse Practitioner | Admitting: Nurse Practitioner

## 2023-09-28 DIAGNOSIS — Z1231 Encounter for screening mammogram for malignant neoplasm of breast: Secondary | ICD-10-CM

## 2023-10-05 ENCOUNTER — Encounter: Payer: Self-pay | Admitting: Nurse Practitioner

## 2023-10-10 ENCOUNTER — Ambulatory Visit (INDEPENDENT_AMBULATORY_CARE_PROVIDER_SITE_OTHER): Payer: BC Managed Care – PPO | Admitting: Nurse Practitioner

## 2023-10-10 ENCOUNTER — Encounter: Payer: Self-pay | Admitting: Nurse Practitioner

## 2023-10-10 VITALS — BP 160/90 | HR 69 | Temp 97.6°F | Ht 63.0 in | Wt 133.4 lb

## 2023-10-10 DIAGNOSIS — Z0000 Encounter for general adult medical examination without abnormal findings: Secondary | ICD-10-CM | POA: Insufficient documentation

## 2023-10-10 DIAGNOSIS — I10 Essential (primary) hypertension: Secondary | ICD-10-CM | POA: Diagnosis not present

## 2023-10-10 DIAGNOSIS — M810 Age-related osteoporosis without current pathological fracture: Secondary | ICD-10-CM | POA: Diagnosis not present

## 2023-10-10 DIAGNOSIS — E559 Vitamin D deficiency, unspecified: Secondary | ICD-10-CM

## 2023-10-10 DIAGNOSIS — E7800 Pure hypercholesterolemia, unspecified: Secondary | ICD-10-CM | POA: Diagnosis not present

## 2023-10-10 DIAGNOSIS — R7989 Other specified abnormal findings of blood chemistry: Secondary | ICD-10-CM

## 2023-10-10 DIAGNOSIS — D508 Other iron deficiency anemias: Secondary | ICD-10-CM

## 2023-10-10 DIAGNOSIS — K900 Celiac disease: Secondary | ICD-10-CM | POA: Diagnosis not present

## 2023-10-10 LAB — COMPREHENSIVE METABOLIC PANEL
ALT: 24 U/L (ref 0–35)
AST: 24 U/L (ref 0–37)
Albumin: 4.3 g/dL (ref 3.5–5.2)
Alkaline Phosphatase: 133 U/L — ABNORMAL HIGH (ref 39–117)
BUN: 14 mg/dL (ref 6–23)
CO2: 28 meq/L (ref 19–32)
Calcium: 9.3 mg/dL (ref 8.4–10.5)
Chloride: 104 meq/L (ref 96–112)
Creatinine, Ser: 0.55 mg/dL (ref 0.40–1.20)
GFR: 94.73 mL/min (ref >60.00)
Glucose, Bld: 89 mg/dL (ref 70–99)
Potassium: 4.3 meq/L (ref 3.5–5.1)
Sodium: 140 meq/L (ref 135–145)
Total Bilirubin: 0.3 mg/dL (ref 0.2–1.2)
Total Protein: 7 g/dL (ref 6.0–8.3)

## 2023-10-10 LAB — LIPID PANEL
Cholesterol: 165 mg/dL (ref 0–200)
HDL: 37.2 mg/dL — ABNORMAL LOW (ref >39.00)
LDL Cholesterol: 96 mg/dL (ref 0–99)
NonHDL: 127.61
Total CHOL/HDL Ratio: 4
Triglycerides: 160 mg/dL — ABNORMAL HIGH (ref 0.0–149.0)
VLDL: 32 mg/dL (ref 0.0–40.0)

## 2023-10-10 LAB — CBC WITH DIFFERENTIAL/PLATELET
Basophils Absolute: 0.1 10*3/uL (ref 0.0–0.1)
Basophils Relative: 1 % (ref 0.0–3.0)
Eosinophils Absolute: 0.4 10*3/uL (ref 0.0–0.7)
Eosinophils Relative: 7.3 % — ABNORMAL HIGH (ref 0.0–5.0)
HCT: 39.2 % (ref 36.0–46.0)
Hemoglobin: 13 g/dL (ref 12.0–15.0)
Lymphocytes Relative: 20.6 % (ref 12.0–46.0)
Lymphs Abs: 1.2 10*3/uL (ref 0.7–4.0)
MCHC: 33.1 g/dL (ref 30.0–36.0)
MCV: 83 fl (ref 78.0–100.0)
Monocytes Absolute: 0.4 10*3/uL (ref 0.1–1.0)
Monocytes Relative: 7.7 % (ref 3.0–12.0)
Neutro Abs: 3.6 10*3/uL (ref 1.4–7.7)
Neutrophils Relative %: 63.4 % (ref 43.0–77.0)
Platelets: 335 10*3/uL (ref 150.0–400.0)
RBC: 4.72 Mil/uL (ref 3.87–5.11)
RDW: 17 % — ABNORMAL HIGH (ref 11.5–15.5)
WBC: 5.7 10*3/uL (ref 4.0–10.5)

## 2023-10-10 LAB — VITAMIN D 25 HYDROXY (VIT D DEFICIENCY, FRACTURES): VITD: 31.14 ng/mL (ref 30.00–100.00)

## 2023-10-10 LAB — FERRITIN: Ferritin: 6.9 ng/mL — ABNORMAL LOW (ref 10.0–291.0)

## 2023-10-10 MED ORDER — LOSARTAN POTASSIUM-HCTZ 50-12.5 MG PO TABS
1.0000 | ORAL_TABLET | Freq: Every day | ORAL | 0 refills | Status: DC
Start: 2023-10-10 — End: 2023-11-13

## 2023-10-10 NOTE — Assessment & Plan Note (Signed)
Check vitamin D and treat based on results.  ?

## 2023-10-10 NOTE — Assessment & Plan Note (Signed)
 Chronic, stable. Continue limiting gluten in diet.

## 2023-10-10 NOTE — Assessment & Plan Note (Signed)
 She recently started rosuvastatin 10mg  daily. Will check CMP, CBC, lipid panel and adjust based on results.

## 2023-10-10 NOTE — Patient Instructions (Signed)
 It was great to see you!  We are checking your labs today and will let you know the results via mychart/phone.   Start losartan-hydrochlorothiazide 1 tablet daily   Keep your checking your blood pressure at home  Let's follow-up in 3-4 weeks, sooner if you have concerns.  If a referral was placed today, you will be contacted for an appointment. Please note that routine referrals can sometimes take up to 3-4 weeks to process. Please call our office if you haven't heard anything after this time frame.  Take care,  Rodman Pickle, NP

## 2023-10-10 NOTE — Assessment & Plan Note (Signed)
 Continue following with hematology. They recommend iron infusions, but no one called to schedule. Reached out to hematology to let them know.

## 2023-10-10 NOTE — Assessment & Plan Note (Signed)
 History of osteoporosis, previously on Fosamax but discontinued due to side effects. Due for a DEXA scan. Scheduled for 04/2024. If still showing osteoporosis or elevated risk of fractures, will start prolia.

## 2023-10-10 NOTE — Assessment & Plan Note (Signed)
 Chronic, not controlled. Will start losartan-hydrochlorothiazide 50-12.5mg  daily. Continue checking blood pressure at home and limiting salt in diet. Follow-up in 3-4 weeks.

## 2023-10-10 NOTE — Assessment & Plan Note (Signed)
Health maintenance reviewed and updated. Discussed nutrition, exercise. Follow-up 1 year.

## 2023-10-10 NOTE — Progress Notes (Signed)
 BP (!) 160/90 (BP Location: Left Arm, Cuff Size: Normal)   Pulse 69   Temp 97.6 F (36.4 C)   Ht 5\' 3"  (1.6 m)   Wt 133 lb 6.4 oz (60.5 kg)   LMP  (LMP Unknown)   SpO2 97%   BMI 23.63 kg/m    Subjective:    Patient ID: Carla Cortez, female    DOB: 1956-05-15, 68 y.o.   MRN: [id number]  CC: Chief Complaint  Patient presents with   Annual Exam    With fasting lab work    HPI: Carla Cortez is a 68 y.o. female presenting on 10/10/2023 for comprehensive medical examination. Current medical complaints include: elevated blood pressure  Blood pressure has been fluctuating at home with readings 130s-150s. She denies chest pain, shortness of breath, and headaches.   She currently lives with: Roommate  Menopausal Symptoms: no  Depression and Anxiety Screen done today and results listed below:     10/10/2023   10:10 AM 09/05/2023    3:00 PM 08/08/2023   10:13 AM  Depression screen PHQ 2/9  Decreased Interest 0 0 1  Down, Depressed, Hopeless 0 0 0  PHQ - 2 Score 0 0 1  Altered sleeping 0  2  Tired, decreased energy 1  2  Change in appetite 0  1  Feeling bad or failure about yourself  0  0  Trouble concentrating 0  0  Moving slowly or fidgety/restless 0  0  Suicidal thoughts 0  0  PHQ-9 Score 1  6      10/10/2023   10:10 AM 08/08/2023   10:13 AM  GAD 7 : Generalized Anxiety Score  Nervous, Anxious, on Edge 0 0  Control/stop worrying 0 0  Worry too much - different things 0 1  Trouble relaxing 0 1  Restless 0 2  Easily annoyed or irritable 0 1  Afraid - awful might happen 0 0  Total GAD 7 Score 0 5  Anxiety Difficulty Not difficult at all     The patient does not have a history of falls. I did not complete a risk assessment for falls. A plan of care for falls was not documented.   Past Medical History:  Past Medical History:  Diagnosis Date   Arthritis    Celiac disease    Heart murmur    IDA (iron deficiency anemia)    Kidney stone    Multiple pelvic fractures  (HCC) 08/13/2014   OAB (overactive bladder)    Osteoporosis    Postmenopausal     Surgical History:  Past Surgical History:  Procedure Laterality Date   CESAREAN SECTION     CHOLECYSTECTOMY     ULNAR NERVE TRANSPOSITION Right     Medications:  Current Outpatient Medications on File Prior to Visit  Medication Sig   Ferrous Gluconate (IRON 27 PO) Take 27 mg by mouth every other day.   ibuprofen (ADVIL) 800 MG tablet    Multiple Vitamins-Minerals (MULTIVITAMIN WITH MINERALS) tablet Take 1 tablet by mouth daily.   rosuvastatin (CRESTOR) 10 MG tablet Take 1 tablet (10 mg total) by mouth daily.   ferrous sulfate 325 (65 FE) MG tablet Take 325 mg by mouth daily with breakfast.  (Patient not taking: Reported on 09/22/2021)   No current facility-administered medications on file prior to visit.    Allergies:  Allergies  Allergen Reactions   Gluten Meal Nausea And Vomiting   Wheat Nausea And Vomiting    Social History:  Social History   Socioeconomic History   Marital status: Single    Spouse name: Not on file   Number of children: Not on file   Years of education: Not on file   Highest education level: Some college, no degree  Occupational History   Not on file  Tobacco Use   Smoking status: Never   Smokeless tobacco: Never  Vaping Use   Vaping status: Never Used  Substance and Sexual Activity   Alcohol use: No    Alcohol/week: 0.0 standard drinks of alcohol   Drug use: No   Sexual activity: Never  Other Topics Concern   Not on file  Social History Narrative   Not on file   Social Drivers of Health   Financial Resource Strain: Low Risk  (08/08/2023)   Overall Financial Resource Strain (CARDIA)    Difficulty of Paying Living Expenses: Not very hard  Food Insecurity: No Food Insecurity (09/05/2023)   Hunger Vital Sign    Worried About Running Out of Food in the Last Year: Never true    Ran Out of Food in the Last Year: Never true  Transportation Needs: No  Transportation Needs (09/05/2023)   PRAPARE - Administrator, Civil Service (Medical): No    Lack of Transportation (Non-Medical): No  Physical Activity: Sufficiently Active (08/08/2023)   Exercise Vital Sign    Days of Exercise per Week: 5 days    Minutes of Exercise per Session: 150+ min  Stress: No Stress Concern Present (08/08/2023)   [employer] of Occupational Health - Occupational Stress Questionnaire    Feeling of Stress : Only a little  Social Connections: Moderately Isolated (08/08/2023)   Social Connection and Isolation Panel [NHANES]    Frequency of Communication with Friends and Family: More than three times a week    Frequency of Social Gatherings with Friends and Family: Once a week    Attends Religious Services: Never    Database administrator or Organizations: No    Attends Engineer, structural: Not on file    Marital Status: Living with partner  Intimate Partner Violence: Not At Risk (09/05/2023)   Humiliation, Afraid, Rape, and Kick questionnaire    Fear of Current or Ex-Partner: No    Emotionally Abused: No    Physically Abused: No    Sexually Abused: No   Social History   Tobacco Use  Smoking Status Never  Smokeless Tobacco Never   Social History   Substance and Sexual Activity  Alcohol Use No   Alcohol/week: 0.0 standard drinks of alcohol    Family History:  Family History  Problem Relation Age of Onset   Hypertension Mother    Heart disease Father    Arthritis Father    Kidney disease Neg Hx    Bladder Cancer Neg Hx     Past medical history, surgical history, medications, allergies, family history and social history reviewed with patient today and changes made to appropriate areas of the chart.   Review of Systems  Constitutional:  Positive for malaise/fatigue. Negative for fever.  HENT:  Positive for congestion and sinus pain.   Eyes: Negative.   Respiratory: Negative.    Cardiovascular: Negative.    Gastrointestinal:  Negative for constipation, nausea and vomiting.       Bloating  Genitourinary: Negative.   Musculoskeletal: Negative.   Skin: Negative.   Neurological:  Positive for headaches. Negative for tingling.  Psychiatric/Behavioral: Negative.     All other ROS  negative except what is listed above and in the HPI.      Objective:    BP (!) 160/90 (BP Location: Left Arm, Cuff Size: Normal)   Pulse 69   Temp 97.6 F (36.4 C)   Ht 5\' 3"  (1.6 m)   Wt 133 lb 6.4 oz (60.5 kg)   LMP  (LMP Unknown)   SpO2 97%   BMI 23.63 kg/m   Wt Readings from Last 3 Encounters:  10/10/23 133 lb 6.4 oz (60.5 kg)  09/05/23 138 lb 1.6 oz (62.6 kg)  08/08/23 133 lb 12.8 oz (60.7 kg)    Physical Exam Vitals and nursing note reviewed.  Constitutional:      General: She is not in acute distress.    Appearance: Normal appearance.  HENT:     Head: Normocephalic and atraumatic.     Right Ear: Tympanic membrane, ear canal and external ear normal.     Left Ear: Tympanic membrane, ear canal and external ear normal.     Nose: Nose normal.     Mouth/Throat:     Pharynx: No posterior oropharyngeal erythema.  Eyes:     Conjunctiva/sclera: Conjunctivae normal.  Cardiovascular:     Rate and Rhythm: Normal rate and regular rhythm.     Pulses: Normal pulses.     Heart sounds: Normal heart sounds.  Pulmonary:     Effort: Pulmonary effort is normal.     Breath sounds: Normal breath sounds.  Abdominal:     Palpations: Abdomen is soft.     Tenderness: There is no abdominal tenderness.  Musculoskeletal:        General: Normal range of motion.     Cervical back: Normal range of motion and neck supple.     Right lower leg: No edema.     Left lower leg: No edema.  Lymphadenopathy:     Cervical: No cervical adenopathy.  Skin:    General: Skin is warm and dry.  Neurological:     General: No focal deficit present.     Mental Status: She is alert and oriented to person, place, and time.      Cranial Nerves: No cranial nerve deficit.     Coordination: Coordination normal.     Gait: Gait normal.  Psychiatric:        Mood and Affect: Mood normal.        Behavior: Behavior normal.        Thought Content: Thought content normal.        Judgment: Judgment normal.     Results for orders placed or performed in visit on 08/08/23  CBC with Differential/Platelet   Collection Time: 08/08/23  9:29 AM  Result Value Ref Range   WBC 4.9 4.0 - 10.5 K/uL   RBC 4.61 3.87 - 5.11 Mil/uL   Hemoglobin 12.2 12.0 - 15.0 g/dL   HCT 47.4 25.9 - 56.3 %   MCV 82.3 78.0 - 100.0 fl   MCHC 32.3 30.0 - 36.0 g/dL   RDW 87.5 (H) 64.3 - 32.9 %   Platelets 388.0 150.0 - 400.0 K/uL   Neutrophils Relative % 61.0 43.0 - 77.0 %   Lymphocytes Relative 24.6 12.0 - 46.0 %   Monocytes Relative 8.2 3.0 - 12.0 %   Eosinophils Relative 5.4 (H) 0.0 - 5.0 %   Basophils Relative 0.8 0.0 - 3.0 %   Neutro Abs 3.0 1.4 - 7.7 K/uL   Lymphs Abs 1.2 0.7 - 4.0 K/uL   Monocytes Absolute  0.4 0.1 - 1.0 K/uL   Eosinophils Absolute 0.3 0.0 - 0.7 K/uL   Basophils Absolute 0.0 0.0 - 0.1 K/uL  Comprehensive metabolic panel   Collection Time: 08/08/23  9:29 AM  Result Value Ref Range   Sodium 140 135 - 145 mEq/L   Potassium 4.0 3.5 - 5.1 mEq/L   Chloride 104 96 - 112 mEq/L   CO2 28 19 - 32 mEq/L   Glucose, Bld 91 70 - 99 mg/dL   BUN 13 6 - 23 mg/dL   Creatinine, Ser 9.14 0.40 - 1.20 mg/dL   Total Bilirubin 0.4 0.2 - 1.2 mg/dL   Alkaline Phosphatase 135 (H) 39 - 117 U/L   AST 24 0 - 37 U/L   ALT 20 0 - 35 U/L   Total Protein 7.2 6.0 - 8.3 g/dL   Albumin 4.3 3.5 - 5.2 g/dL   GFR 78.29 >56.21 mL/min   Calcium 9.3 8.4 - 10.5 mg/dL  Lipid panel   Collection Time: 08/08/23  9:29 AM  Result Value Ref Range   Cholesterol 253 (H) 0 - 200 mg/dL   Triglycerides 308.6 (H) 0.0 - 149.0 mg/dL   HDL 57.84 >69.62 mg/dL   VLDL 95.2 0.0 - 84.1 mg/dL   LDL Cholesterol 324 (H) 0 - 99 mg/dL   Total CHOL/HDL Ratio 6    NonHDL 213.96    TSH   Collection Time: 08/08/23  9:29 AM  Result Value Ref Range   TSH 1.17 0.35 - 5.50 uIU/mL  VITAMIN D 25 Hydroxy (Vit-D Deficiency, Fractures)   Collection Time: 08/08/23  9:29 AM  Result Value Ref Range   VITD 26.83 (L) 30.00 - 100.00 ng/mL  Iron, TIBC and Ferritin Panel   Collection Time: 08/08/23  9:29 AM  Result Value Ref Range   Iron 81 45 - 160 mcg/dL   TIBC 401 (H) 027 - 253 mcg/dL (calc)   %SAT 17 16 - 45 % (calc)   Ferritin 7 (L) 16 - 288 ng/mL      Assessment & Plan:   Problem List Items Addressed This Visit       Cardiovascular and Mediastinum   Primary hypertension   Chronic, not controlled. Will start losartan-hydrochlorothiazide 50-12.5mg  daily. Continue checking blood pressure at home and limiting salt in diet. Follow-up in 3-4 weeks.       Relevant Medications   losartan-hydrochlorothiazide (HYZAAR) 50-12.5 MG tablet     Digestive   Celiac disease (Chronic)   Chronic, stable. Continue limiting gluten in diet.         Musculoskeletal and Integument   OP (osteoporosis)   History of osteoporosis, previously on Fosamax but discontinued due to side effects. Due for a DEXA scan. Scheduled for 04/2024. If still showing osteoporosis or elevated risk of fractures, will start prolia.         Other   Iron deficiency anemia (Chronic)   Continue following with hematology. They recommend iron infusions, but no one called to schedule. Reached out to hematology to let them know.       Relevant Medications   Ferrous Gluconate (IRON 27 PO)   Other Relevant Orders   CBC with Differential/Platelet   Ferritin   Low serum vitamin D   Check vitamin D and treat based on results.      Relevant Orders   VITAMIN D 25 Hydroxy (Vit-D Deficiency, Fractures)   Pure hypercholesterolemia   She recently started rosuvastatin 10mg  daily. Will check CMP, CBC, lipid panel and adjust based  on results.       Relevant Medications   losartan-hydrochlorothiazide (HYZAAR)  50-12.5 MG tablet   Other Relevant Orders   CBC with Differential/Platelet   Comprehensive metabolic panel   Lipid panel   Routine general medical examination at a health care facility - Primary   Health maintenance reviewed and updated. Discussed nutrition, exercise. Follow-up 1 year.          Follow up plan: Return in about 4 weeks (around 11/07/2023) for HTN.   LABORATORY TESTING:  - Pap smear: not applicable  IMMUNIZATIONS:   - Tdap: Tetanus vaccination status reviewed: last tetanus booster within 10 years. - Influenza: Declined - Pneumovax: Not applicable - Prevnar: Declined - HPV: Not applicable - Shingrix vaccine: Declined  SCREENING: -Mammogram: Up to date  - Colonoscopy: Declined  - Bone Density:  scheduled 04/2024    PATIENT COUNSELING:   Advised to take 1 mg of folate supplement per day if capable of pregnancy.   Sexuality: Discussed sexually transmitted diseases, partner selection, use of condoms, avoidance of unintended pregnancy  and contraceptive alternatives.   Advised to avoid cigarette smoking.  I discussed with the patient that most people either abstain from alcohol or drink within safe limits (<=14/week and <=4 drinks/occasion for males, <=7/weeks and <= 3 drinks/occasion for females) and that the risk for alcohol disorders and other health effects rises proportionally with the number of drinks per week and how often a drinker exceeds daily limits.  Discussed cessation/primary prevention of drug use and availability of treatment for abuse.   Diet: Encouraged to adjust caloric intake to maintain  or achieve ideal body weight, to reduce intake of dietary saturated fat and total fat, to limit sodium intake by avoiding high sodium foods and not adding table salt, and to maintain adequate dietary potassium and calcium preferably from fresh fruits, vegetables, and low-fat dairy products.    stressed the importance of regular exercise  Injury prevention:  Discussed safety belts, safety helmets, smoke detector, smoking near bedding or upholstery.   Dental health: Discussed importance of regular tooth brushing, flossing, and dental visits.    NEXT PREVENTATIVE PHYSICAL DUE IN 1 YEAR. Return in about 4 weeks (around 11/07/2023) for HTN.  Sherly Brodbeck A Emry Barbato

## 2023-10-11 ENCOUNTER — Encounter: Payer: Self-pay | Admitting: Nurse Practitioner

## 2023-11-13 ENCOUNTER — Encounter: Payer: Self-pay | Admitting: Nurse Practitioner

## 2023-11-13 ENCOUNTER — Ambulatory Visit: Admitting: Nurse Practitioner

## 2023-11-13 VITALS — BP 112/64 | HR 74 | Temp 97.3°F | Ht 63.0 in | Wt 132.8 lb

## 2023-11-13 DIAGNOSIS — I10 Essential (primary) hypertension: Secondary | ICD-10-CM | POA: Diagnosis not present

## 2023-11-13 DIAGNOSIS — D508 Other iron deficiency anemias: Secondary | ICD-10-CM | POA: Diagnosis not present

## 2023-11-13 LAB — BASIC METABOLIC PANEL WITH GFR
BUN: 19 mg/dL (ref 6–23)
CO2: 29 meq/L (ref 19–32)
Calcium: 9.3 mg/dL (ref 8.4–10.5)
Chloride: 101 meq/L (ref 96–112)
Creatinine, Ser: 0.71 mg/dL (ref 0.40–1.20)
GFR: 87.82 mL/min (ref >60.00)
Glucose, Bld: 99 mg/dL (ref 70–99)
Potassium: 3.6 meq/L (ref 3.5–5.1)
Sodium: 137 meq/L (ref 135–145)

## 2023-11-13 MED ORDER — LOSARTAN POTASSIUM-HCTZ 50-12.5 MG PO TABS
1.0000 | ORAL_TABLET | Freq: Every day | ORAL | 1 refills | Status: DC
Start: 2023-11-13 — End: 2024-05-14

## 2023-11-13 NOTE — Assessment & Plan Note (Signed)
 Called the infusion clinic who states the iron  ordered was supportive and not active. They are going to reach out to hematology to get the orders clarified.

## 2023-11-13 NOTE — Progress Notes (Signed)
   Established Patient Office Visit  Subjective   Patient ID: Carla Cortez, female    DOB: 1956-05-03  Age: 68 y.o. MRN: [id number]  Chief Complaint  Patient presents with   Hypertension    Follow up, no concerns   HPI:  Discussed the use of AI scribe software for clinical note transcription with the patient, who gave verbal consent to proceed.  History of Present Illness   The patient, with a history of hypertension, presents for a follow-up visit. She reports that her blood pressure has been well controlled at home, with readings 110s-130s, which is a significant improvement. The patient is currently on losartan  hydrochlorothiazide for blood pressure management. Initially, she thought she was experiencing side effects from the medication, but these seem to have subsided.  She denies chest pain and shortness of breath.       ROS See pertinent positives and negatives per HPI.    Objective:     BP 112/64 (BP Location: Left Arm, Patient Position: Sitting, Cuff Size: Small)   Pulse 74   Temp (!) 97.3 F (36.3 C)   Ht 5\' 3"  (1.6 m)   Wt 132 lb 12.8 oz (60.2 kg)   LMP  (LMP Unknown)   SpO2 96%   BMI 23.52 kg/m    Physical Exam Vitals and nursing note reviewed.  Constitutional:      General: She is not in acute distress.    Appearance: Normal appearance.  HENT:     Head: Normocephalic.  Eyes:     Conjunctiva/sclera: Conjunctivae normal.  Cardiovascular:     Rate and Rhythm: Normal rate and regular rhythm.     Pulses: Normal pulses.     Heart sounds: Normal heart sounds.  Pulmonary:     Effort: Pulmonary effort is normal.     Breath sounds: Normal breath sounds.  Musculoskeletal:     Cervical back: Normal range of motion.  Skin:    General: Skin is warm.  Neurological:     General: No focal deficit present.     Mental Status: She is alert and oriented to person, place, and time.  Psychiatric:        Mood and Affect: Mood normal.        Behavior: Behavior normal.         Thought Content: Thought content normal.        Judgment: Judgment normal.    The 10-year ASCVD risk score (Arnett DK, et al., 2019) is: 7.4%    Assessment & Plan:   Problem List Items Addressed This Visit       Cardiovascular and Mediastinum   Primary hypertension - Primary   Chronic, stable. Continue losartan  hydrochlorothiazide 50-12.5mg  daily and monitor blood pressure at home. Educate on recognizing and reporting hypotension symptoms. Schedule follow-up in six months and send prescription refill for losartan  hydrochlorothiazide. Check BMP today      Relevant Medications   losartan -hydrochlorothiazide (HYZAAR) 50-12.5 MG tablet   Other Relevant Orders   Basic metabolic panel with GFR     Other   Iron  deficiency anemia (Chronic)   Called the infusion clinic who states the iron  ordered was supportive and not active. They are going to reach out to hematology to get the orders clarified.       Return in about 6 months (around 05/14/2024) for HTN.    Odette Benjamin, NP

## 2023-11-13 NOTE — Patient Instructions (Signed)
 It was great to see you!  We are checking your labs today and will let you know the results via mychart/phone.   The infusion clinic should reach out to you about the iron  infusion  Let's follow-up in 6 months, sooner if you have concerns.  If a referral was placed today, you will be contacted for an appointment. Please note that routine referrals can sometimes take up to 3-4 weeks to process. Please call our office if you haven't heard anything after this time frame.  Take care,  Rheba Cedar, NP

## 2023-11-13 NOTE — Assessment & Plan Note (Signed)
 Chronic, stable. Continue losartan  hydrochlorothiazide 50-12.5mg  daily and monitor blood pressure at home. Educate on recognizing and reporting hypotension symptoms. Schedule follow-up in six months and send prescription refill for losartan  hydrochlorothiazide. Check BMP today

## 2023-12-20 ENCOUNTER — Encounter: Payer: Self-pay | Admitting: Internal Medicine

## 2023-12-20 ENCOUNTER — Ambulatory Visit: Admitting: Internal Medicine

## 2023-12-20 VITALS — BP 104/64 | HR 88 | Temp 97.6°F | Ht 63.0 in | Wt 133.4 lb

## 2023-12-20 DIAGNOSIS — R300 Dysuria: Secondary | ICD-10-CM

## 2023-12-20 DIAGNOSIS — K5900 Constipation, unspecified: Secondary | ICD-10-CM | POA: Diagnosis not present

## 2023-12-20 LAB — POC URINALSYSI DIPSTICK (AUTOMATED)
Bilirubin, UA: POSITIVE
Blood, UA: NEGATIVE
Glucose, UA: NEGATIVE
Ketones, UA: NEGATIVE
Leukocytes, UA: NEGATIVE
Nitrite, UA: NEGATIVE
Protein, UA: NEGATIVE
Spec Grav, UA: >=1.03 — AB (ref 1.010–1.025)
Urobilinogen, UA: NEGATIVE U/dL — AB
pH, UA: 6 (ref 5.0–8.0)

## 2023-12-20 MED ORDER — SULFAMETHOXAZOLE-TRIMETHOPRIM 800-160 MG PO TABS: 1.0000 | ORAL_TABLET | Freq: Two times a day (BID) | ORAL | 0 refills | Status: AC

## 2023-12-20 NOTE — Patient Instructions (Signed)
 Drink plenty of water    If remain constipated - Use 1 capful of Miralax  once daily until Bowel movement.

## 2023-12-20 NOTE — Progress Notes (Signed)
 [facility] [address] [place] [address] Dept: [phone] Dept Fax: [phone]  Acute Care Office Visit  Subjective:   Carla Cortez 10-Feb-1956 12/20/2023  Chief Complaint  Patient presents with   Abdominal Pain    Started few weeks ago     HPI: Discussed the use of AI scribe software for clinical note transcription with the patient, who gave verbal consent to proceed.  History of Present Illness   Carla Hams "Kathi" is a 68 year old female with overactive bladder who presents with right-sided abdominal and groin pain, increased urinary frequency, and foul-smelling urine.  She initially experienced pain on the left lower back a few weeks ago, which she attributed to prolonged sitting during a drive to [place] . Last week, the pain intensified on the right side, alleviated temporarily by ibuprofen but returning after the medication wore off. Currently, she has pain in the right upper abdomen and groin area.  She has noticed an increase in urinary frequency over the past few days, with a sensation of pressure in the bladder area even after urination. There is a burning sensation during urination, though not severe, and occasional discomfort post-urination. No hematuria, but she reports a foul smell and darker color recently. Her current urinary frequency is higher than her usual pattern due to her history of overactive bladder. No recent changes in medications.  She reports constipation over the last few days, which is unusual for her, as she typically has daily bowel movements. No hematochezia.  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 She mentions being exhausted due to a busy workday and being shorthanded at work. No fever, chills, vomiting, rash, or vaginal discharge, or blood in urine.       The following  portions of the patient's history were reviewed and updated as appropriate: past medical history, past surgical history, family history, social history, allergies, medications, and problem list.   Patient Active Problem List   Diagnosis Date Noted   Routine general medical examination at a health care facility 10/10/2023   Primary hypertension 10/10/2023   Arthralgia of left hand 08/08/2023   Elevated blood pressure reading 08/08/2023   Bleeding nose 08/08/2023   Cubital tunnel syndrome on right 08/08/2023   Tinnitus of left ear 09/23/2021   OAB (overactive bladder) 09/23/2015   Low serum vitamin D  08/18/2015   OP (osteoporosis) 08/18/2015   Pure hypercholesterolemia 08/18/2015   Celiac disease 08/14/2014   Iron  deficiency anemia 08/14/2014   Past Medical History:  Diagnosis Date   Arthritis    Celiac disease    Heart murmur    IDA (iron  deficiency anemia)    Kidney stone    Multiple pelvic fractures (HCC) 08/13/2014   OAB (overactive bladder)    Osteoporosis    Postmenopausal    Past Surgical History:  Procedure Laterality Date   CESAREAN SECTION     CHOLECYSTECTOMY     ULNAR NERVE TRANSPOSITION Right    Family History  Problem Relation Age of Onset   Hypertension Mother    Heart disease Father    Arthritis Father    Kidney disease Neg Hx    Bladder Cancer Neg Hx     Current Outpatient Medications:    Ferrous Gluconate (IRON  27 PO), Take 27 mg by mouth every other day., Disp: , Rfl:    ibuprofen (ADVIL) 800 MG tablet, Take 800 mg  by mouth as needed., Disp: , Rfl:    losartan -hydrochlorothiazide (HYZAAR) 50-12.5 MG tablet, Take 1 tablet by mouth daily., Disp: 90 tablet, Rfl: 1   Multiple Vitamins-Minerals (MULTIVITAMIN WITH MINERALS) tablet, Take 1 tablet by mouth daily., Disp: , Rfl:    rosuvastatin  (CRESTOR ) 10 MG tablet, Take 1 tablet (10 mg total) by mouth daily., Disp: 90 tablet, Rfl: 0   sulfamethoxazole-trimethoprim (BACTRIM DS) 800-160 MG tablet, Take 1  tablet by mouth 2 (two) times daily for 7 days., Disp: 14 tablet, Rfl: 0   ferrous sulfate 325 (65 FE) MG tablet, Take 325 mg by mouth daily with breakfast.  (Patient not taking: Reported on 11/13/2023), Disp: , Rfl:  Allergies  Allergen Reactions   Gluten Meal Nausea And Vomiting   Wheat Nausea And Vomiting     ROS: A complete ROS was performed with pertinent positives/negatives noted in the HPI. The remainder of the ROS are negative.    Objective:   Today's Vitals   12/20/23 1402  BP: 104/64  Pulse: 88  Temp: 97.6 F (36.4 C)  TempSrc: Temporal  SpO2: 94%  Weight: 133 lb 6.4 oz (60.5 kg)  Height: 5\' 3"  (1.6 m)    GENERAL: Well-appearing, in NAD. Well nourished.  SKIN: Pink, warm and dry. No rash, lesion, ulceration, or ecchymoses.  NECK: Trachea midline. Full ROM w/o pain or tenderness. No lymphadenopathy.  RESPIRATORY: Chest wall symmetrical. Respirations even and non-labored. Breath sounds clear to auscultation bilaterally.  CARDIAC: S1, S2 present, regular rate and rhythm. Peripheral pulses 2+ bilaterally.  GI: Abdomen soft, suprapubic tenderness and R. Groin tenderness with palpation. Normoactive bowel sounds. No rebound tenderness. No hepatomegaly or splenomegaly. No CVA tenderness.  MSK: Muscle tone and strength appropriate for age. EXTREMITIES: Without clubbing, cyanosis, or edema.  NEUROLOGIC: Steady, even gait.  PSYCH/MENTAL STATUS: Alert, oriented x 3. Cooperative, appropriate mood and affect.    Results for orders placed or performed in visit on 12/20/23  POCT Urinalysis Dipstick (Automated)  Result Value Ref Range   Color, UA yellow    Clarity, UA clear    Glucose, UA Negative Negative   Bilirubin, UA positive    Ketones, UA neg    Spec Grav, UA >=1.030 (A) 1.010 - 1.025   Blood, UA neg    pH, UA 6.0 5.0 - 8.0   Protein, UA Negative Negative   Urobilinogen, UA negative (A) 0.2 or 1.0 E.U./dL   Nitrite, UA neg    Leukocytes, UA Negative Negative       Assessment & Plan:  Assessment and Plan    Dysuria Suspected UTI with symptoms of frequency, urgency, dysuria, and malodorous urine. urine culture ordered. Empirical antibiotics started due to symptomatology and risk of progression. - Initiate Bactrim, one capsule twice daily for 7 days. - Order urine culture for diagnostic confirmation. - Recommend acetaminophen  as needed for analgesia. - Encourage increased hydration. - Instruct to return if symptoms exacerbate or persist.  Constipation Recent onset constipation, potentially causing abdominal discomfort but unrelated to urinary symptoms. - Recommend increased hydration. - Suggest stool softeners like docusate if needed. - Advise polyethylene glycol if bowel movements do not normalize within 1-2 days.      Meds ordered this encounter  Medications   sulfamethoxazole-trimethoprim (BACTRIM DS) 800-160 MG tablet    Sig: Take 1 tablet by mouth 2 (two) times daily for 7 days.    Dispense:  14 tablet    Refill:  0    Supervising Provider:   Hildy Lowers,  AARON B [[id number]]   Orders Placed This Encounter  Procedures   Urine Culture   POCT Urinalysis Dipstick (Automated)   Lab Orders         Urine Culture         POCT Urinalysis Dipstick (Automated)     No images are attached to the encounter or orders placed in the encounter.  Return if symptoms worsen or fail to improve.   Gavin Kast, FNP

## 2023-12-21 LAB — URINE CULTURE
MICRO NUMBER:: 16508641
SPECIMEN QUALITY:: ADEQUATE

## 2023-12-22 ENCOUNTER — Ambulatory Visit: Payer: Self-pay | Admitting: Internal Medicine

## 2023-12-26 ENCOUNTER — Ambulatory Visit: Admitting: Nurse Practitioner

## 2024-01-02 ENCOUNTER — Other Ambulatory Visit: Payer: Self-pay

## 2024-01-03 ENCOUNTER — Inpatient Hospital Stay: Payer: BC Managed Care – PPO | Attending: Hematology

## 2024-01-03 ENCOUNTER — Other Ambulatory Visit: Payer: Self-pay

## 2024-01-03 DIAGNOSIS — D509 Iron deficiency anemia, unspecified: Secondary | ICD-10-CM | POA: Diagnosis present

## 2024-01-03 DIAGNOSIS — D508 Other iron deficiency anemias: Secondary | ICD-10-CM

## 2024-01-03 LAB — CMP (CANCER CENTER ONLY)
ALT: 28 U/L (ref 0–44)
AST: 29 U/L (ref 15–41)
Albumin: 4.3 g/dL (ref 3.5–5.0)
Alkaline Phosphatase: 102 U/L (ref 38–126)
Anion gap: 12 (ref 5–15)
BUN: 18 mg/dL (ref 8–23)
CO2: 24 mmol/L (ref 22–32)
Calcium: 9.2 mg/dL (ref 8.9–10.3)
Chloride: 101 mmol/L (ref 98–111)
Creatinine: 0.85 mg/dL (ref 0.44–1.00)
GFR, Estimated: >60 mL/min (ref >60)
Glucose, Bld: 103 mg/dL — ABNORMAL HIGH (ref 70–99)
Potassium: 2.8 mmol/L — ABNORMAL LOW (ref 3.5–5.1)
Sodium: 137 mmol/L (ref 135–145)
Total Bilirubin: 0.4 mg/dL (ref 0.0–1.2)
Total Protein: 7.1 g/dL (ref 6.5–8.1)

## 2024-01-03 LAB — CBC WITH DIFFERENTIAL (CANCER CENTER ONLY)
Abs Immature Granulocytes: 0.02 10*3/uL (ref 0.00–0.07)
Basophils Absolute: 0.1 10*3/uL (ref 0.0–0.1)
Basophils Relative: 1 %
Eosinophils Absolute: 0.2 10*3/uL (ref 0.0–0.5)
Eosinophils Relative: 4 %
HCT: 34.8 % — ABNORMAL LOW (ref 36.0–46.0)
Hemoglobin: 12.1 g/dL (ref 12.0–15.0)
Immature Granulocytes: 0 %
Lymphocytes Relative: 24 %
Lymphs Abs: 1.6 10*3/uL (ref 0.7–4.0)
MCH: 29.5 pg (ref 26.0–34.0)
MCHC: 34.8 g/dL (ref 30.0–36.0)
MCV: 84.9 fL (ref 80.0–100.0)
Monocytes Absolute: 0.6 10*3/uL (ref 0.1–1.0)
Monocytes Relative: 9 %
Neutro Abs: 4 10*3/uL (ref 1.7–7.7)
Neutrophils Relative %: 62 %
Platelet Count: 353 10*3/uL (ref 150–400)
RBC: 4.1 MIL/uL (ref 3.87–5.11)
RDW: 14.6 % (ref 11.5–15.5)
WBC Count: 6.5 10*3/uL (ref 4.0–10.5)
nRBC: 0 % (ref 0.0–0.2)

## 2024-05-07 ENCOUNTER — Inpatient Hospital Stay: Payer: BC Managed Care – PPO

## 2024-05-14 ENCOUNTER — Ambulatory Visit: Payer: Self-pay | Admitting: Nurse Practitioner

## 2024-05-14 ENCOUNTER — Ambulatory Visit: Admitting: Nurse Practitioner

## 2024-05-14 VITALS — BP 112/62 | HR 69 | Temp 96.9°F | Ht 63.0 in | Wt 132.4 lb

## 2024-05-14 DIAGNOSIS — I10 Essential (primary) hypertension: Secondary | ICD-10-CM | POA: Diagnosis not present

## 2024-05-14 DIAGNOSIS — R7989 Other specified abnormal findings of blood chemistry: Secondary | ICD-10-CM

## 2024-05-14 DIAGNOSIS — M810 Age-related osteoporosis without current pathological fracture: Secondary | ICD-10-CM | POA: Diagnosis not present

## 2024-05-14 DIAGNOSIS — D508 Other iron deficiency anemias: Secondary | ICD-10-CM

## 2024-05-14 DIAGNOSIS — E7800 Pure hypercholesterolemia, unspecified: Secondary | ICD-10-CM | POA: Diagnosis not present

## 2024-05-14 DIAGNOSIS — Z1211 Encounter for screening for malignant neoplasm of colon: Secondary | ICD-10-CM

## 2024-05-14 DIAGNOSIS — Z1159 Encounter for screening for other viral diseases: Secondary | ICD-10-CM

## 2024-05-14 LAB — CBC WITH DIFFERENTIAL/PLATELET
Basophils Absolute: 0.1 K/uL (ref 0.0–0.1)
Basophils Relative: 1 % (ref 0.0–3.0)
Eosinophils Absolute: 0.3 K/uL (ref 0.0–0.7)
Eosinophils Relative: 5.1 % — ABNORMAL HIGH (ref 0.0–5.0)
HCT: 38.9 % (ref 36.0–46.0)
Hemoglobin: 13.4 g/dL (ref 12.0–15.0)
Lymphocytes Relative: 18.7 % (ref 12.0–46.0)
Lymphs Abs: 1.1 K/uL (ref 0.7–4.0)
MCHC: 34.4 g/dL (ref 30.0–36.0)
MCV: 88.7 fl (ref 78.0–100.0)
Monocytes Absolute: 0.4 K/uL (ref 0.1–1.0)
Monocytes Relative: 7.3 % (ref 3.0–12.0)
Neutro Abs: 4.2 K/uL (ref 1.4–7.7)
Neutrophils Relative %: 67.9 % (ref 43.0–77.0)
Platelets: 374 K/uL (ref 150.0–400.0)
RBC: 4.39 Mil/uL (ref 3.87–5.11)
RDW: 13.1 % (ref 11.5–15.5)
WBC: 6.1 K/uL (ref 4.0–10.5)

## 2024-05-14 LAB — LIPID PANEL
Cholesterol: 273 mg/dL — ABNORMAL HIGH (ref 0–200)
HDL: 36.7 mg/dL — ABNORMAL LOW (ref >39.00)
LDL Cholesterol: 193 mg/dL — ABNORMAL HIGH (ref 0–99)
NonHDL: 235.86
Total CHOL/HDL Ratio: 7
Triglycerides: 212 mg/dL — ABNORMAL HIGH (ref 0.0–149.0)
VLDL: 42.4 mg/dL — ABNORMAL HIGH (ref 0.0–40.0)

## 2024-05-14 LAB — COMPREHENSIVE METABOLIC PANEL WITH GFR
ALT: 25 U/L (ref 0–35)
AST: 22 U/L (ref 0–37)
Albumin: 4.5 g/dL (ref 3.5–5.2)
Alkaline Phosphatase: 97 U/L (ref 39–117)
BUN: 24 mg/dL — ABNORMAL HIGH (ref 6–23)
CO2: 29 meq/L (ref 19–32)
Calcium: 9.3 mg/dL (ref 8.4–10.5)
Chloride: 101 meq/L (ref 96–112)
Creatinine, Ser: 0.77 mg/dL (ref 0.40–1.20)
GFR: 79.39 mL/min (ref >60.00)
Glucose, Bld: 94 mg/dL (ref 70–99)
Potassium: 4 meq/L (ref 3.5–5.1)
Sodium: 139 meq/L (ref 135–145)
Total Bilirubin: 0.4 mg/dL (ref 0.2–1.2)
Total Protein: 7.2 g/dL (ref 6.0–8.3)

## 2024-05-14 LAB — VITAMIN D 25 HYDROXY (VIT D DEFICIENCY, FRACTURES): VITD: 41.25 ng/mL (ref 30.00–100.00)

## 2024-05-14 LAB — IRON: Iron: 76 ug/dL (ref 42–145)

## 2024-05-14 LAB — FERRITIN: Ferritin: 19.4 ng/mL (ref 10.0–291.0)

## 2024-05-14 MED ORDER — LOSARTAN POTASSIUM-HCTZ 50-12.5 MG PO TABS: 1.0000 | ORAL_TABLET | Freq: Every day | ORAL | 1 refills | Status: DC

## 2024-05-14 MED ORDER — ROSUVASTATIN CALCIUM 10 MG PO TABS: 10.0000 mg | ORAL_TABLET | Freq: Every day | ORAL | 3 refills | Status: AC

## 2024-05-14 NOTE — Assessment & Plan Note (Signed)
Check vitamin D and treat based on results.  ?

## 2024-05-14 NOTE — Assessment & Plan Note (Signed)
 Chronic, ongoing. She has not been on rosuvastatin  as the RX ran out. Re-start rosuvastatin  10mg  daily. Check CMP, CBC, lipid panel today. Follow-up in 6 months.

## 2024-05-14 NOTE — Assessment & Plan Note (Signed)
 She has still not heard from the hematology clinic, even after reaching out to them. She is taking oral supplements daily. Check CBC, iron , ferritin today.

## 2024-05-14 NOTE — Assessment & Plan Note (Signed)
 History of osteoporosis, previously on Fosamax but discontinued due to side effects. Due for a DEXA scan, will get rescheduled since original location no longer performs them. Will start prolia if still showing osteoporosis.

## 2024-05-14 NOTE — Patient Instructions (Signed)
 It was great to see you!  I have placed a new order dexa scan   [facility]: [id number] [place], [place] [address], Ph [phone]  I have placed a referral for colonoscopy   We are checking your labs today and will let you know the results via mychart/phone.   Let's follow-up in 6 months, sooner if you have concerns.  If a referral was placed today, you will be contacted for an appointment. Please note that routine referrals can sometimes take up to 3-4 weeks to process. Please call our office if you haven't heard anything after this time frame.  Take care,  Tinnie Harada, NP

## 2024-05-14 NOTE — Assessment & Plan Note (Signed)
 Chronic, stable. Continue losartan  hydrochlorothiazide 50-12.5mg  daily and monitor blood pressure at home. Check CMP, CBC today. Follow-up in 6 months.

## 2024-05-14 NOTE — Progress Notes (Signed)
 Established Patient Office Visit  Subjective   Patient ID: Carla Cortez, female    DOB: 1955/08/13  Age: 68 y.o. MRN: [id number]  Chief Complaint  Patient presents with   Hypertension    Follow up, no concerns    HPI Discussed the use of AI scribe software for clinical note transcription with the patient, who gave verbal consent to proceed.  History of Present Illness   Carla Cortez is a 68 year old female who presents for a routine follow-up visit.  Her blood pressure readings at home are around 120/83 mmHg. She is not experiencing chest pain, shortness of breath, or leg swelling. She has not been taking rosuvastatin  as the prescription ran out.  She feels tired and continues to take iron  supplements. She has not received any iron  infusions as she has not heard from the hematology office about scheduling. Her potassium levels were low in June, and she canceled a follow-up blood test due to cost concerns.  She had a colonoscopy in 2021 where polyps were removed and is unsure about scheduling the next one. She has not had a bone density scan as the referred facility is no longer performing them.        ROS See pertinent positives and negatives per HPI.    Objective:     BP 112/62 (BP Location: Left Arm, Patient Position: Sitting, Cuff Size: Small)   Pulse 69   Temp (!) 96.9 F (36.1 C)   Ht 5' 3 (1.6 m)   Wt 132 lb 6.4 oz (60.1 kg)   LMP  (LMP Unknown)   SpO2 95%   BMI 23.45 kg/m     Physical Exam Vitals and nursing note reviewed.  Constitutional:      General: She is not in acute distress.    Appearance: Normal appearance.  HENT:     Head: Normocephalic.  Eyes:     Conjunctiva/sclera: Conjunctivae normal.  Cardiovascular:     Rate and Rhythm: Normal rate and regular rhythm.     Pulses: Normal pulses.     Heart sounds: Normal heart sounds.  Pulmonary:     Effort: Pulmonary effort is normal.     Breath sounds: Normal breath sounds.  Musculoskeletal:      Cervical back: Normal range of motion.  Skin:    General: Skin is warm.  Neurological:     General: No focal deficit present.     Mental Status: She is alert and oriented to person, place, and time.  Psychiatric:        Mood and Affect: Mood normal.        Behavior: Behavior normal.        Thought Content: Thought content normal.        Judgment: Judgment normal.     The 10-year ASCVD risk score (Arnett DK, et al., 2019) is: 8.2%    Assessment & Plan:   Problem List Items Addressed This Visit       Cardiovascular and Mediastinum   Primary hypertension - Primary   Chronic, stable. Continue losartan  hydrochlorothiazide 50-12.5mg  daily and monitor blood pressure at home. Check CMP, CBC today. Follow-up in 6 months.       Relevant Medications   rosuvastatin  (CRESTOR ) 10 MG tablet   losartan -hydrochlorothiazide (HYZAAR) 50-12.5 MG tablet   Other Relevant Orders   CBC with Differential/Platelet   Comprehensive metabolic panel with GFR     Musculoskeletal and Integument   OP (osteoporosis)   History of osteoporosis, previously  on Fosamax but discontinued due to side effects. Due for a DEXA scan, will get rescheduled since original location no longer performs them. Will start prolia if still showing osteoporosis.       Relevant Orders   DG Bone Density     Other   Iron  deficiency anemia (Chronic)   She has still not heard from the hematology clinic, even after reaching out to them. She is taking oral supplements daily. Check CBC, iron , ferritin today.       Relevant Orders   CBC with Differential/Platelet   Ferritin   Iron    Low serum vitamin D    Check vitamin D  and treat based on results.      Relevant Orders   VITAMIN D  25 Hydroxy (Vit-D Deficiency, Fractures)   Pure hypercholesterolemia   Chronic, ongoing. She has not been on rosuvastatin  as the RX ran out. Re-start rosuvastatin  10mg  daily. Check CMP, CBC, lipid panel today. Follow-up in 6 months.        Relevant Medications   rosuvastatin  (CRESTOR ) 10 MG tablet   losartan -hydrochlorothiazide (HYZAAR) 50-12.5 MG tablet   Other Relevant Orders   CBC with Differential/Platelet   Comprehensive metabolic panel with GFR   Lipid panel   Other Visit Diagnoses       Screen for colon cancer       Due for colonoscopy, referral placed to GI   Relevant Orders   Ambulatory referral to Gastroenterology     Encounter for hepatitis C screening test for low risk patient       Screen hepatitits C today   Relevant Orders   Hepatitis C antibody       Return in about 6 months (around 11/12/2024) for CPE.    Tinnie DELENA Harada, NP

## 2024-05-15 LAB — HEPATITIS C ANTIBODY: Hepatitis C Ab: NONREACTIVE

## 2024-05-16 ENCOUNTER — Other Ambulatory Visit: Payer: BC Managed Care – PPO

## 2024-05-20 ENCOUNTER — Ambulatory Visit
Admission: RE | Admit: 2024-05-20 | Discharge: 2024-05-20 | Disposition: A | Source: Ambulatory Visit | Attending: Nurse Practitioner | Admitting: Nurse Practitioner

## 2024-05-20 ENCOUNTER — Inpatient Hospital Stay: Admission: RE | Admit: 2024-05-20 | Source: Ambulatory Visit

## 2024-05-20 DIAGNOSIS — M810 Age-related osteoporosis without current pathological fracture: Secondary | ICD-10-CM

## 2024-05-29 ENCOUNTER — Other Ambulatory Visit: Payer: Self-pay | Admitting: Medical Genetics

## 2024-05-29 MED ORDER — ROMOSOZUMAB-AQQG 105 MG/1.17ML ~~LOC~~ SOSY: 210.0000 mg | PREFILLED_SYRINGE | SUBCUTANEOUS | Status: AC

## 2024-05-30 ENCOUNTER — Telehealth: Payer: Self-pay

## 2024-05-30 NOTE — Telephone Encounter (Signed)
 Evenity  VOB initiated via [email or web address]  Last OV:  Next OV:  Last Evenity  inj:  Next Evenity  inj DUE: [place]

## 2024-06-03 ENCOUNTER — Other Ambulatory Visit (HOSPITAL_COMMUNITY): Payer: Self-pay

## 2024-06-03 ENCOUNTER — Encounter: Payer: Self-pay | Admitting: Oncology

## 2024-06-03 NOTE — Telephone Encounter (Signed)
 Carla Cortez

## 2024-06-03 NOTE — Telephone Encounter (Signed)
 PHARMACY PA SUBMITTED VIA LATENT. KEY: [id number]

## 2024-06-04 ENCOUNTER — Other Ambulatory Visit (HOSPITAL_COMMUNITY): Payer: Self-pay

## 2024-06-04 NOTE — Telephone Encounter (Signed)
 MEDICAL PA SUBMITTED VIA FAX.   FAX: [phone]

## 2024-06-04 NOTE — Telephone Encounter (Signed)
 PHARMACY PA APPROVED -

## 2024-06-06 ENCOUNTER — Other Ambulatory Visit (HOSPITAL_COMMUNITY): Payer: Self-pay

## 2024-06-07 ENCOUNTER — Other Ambulatory Visit (HOSPITAL_COMMUNITY): Payer: Self-pay

## 2024-06-07 NOTE — Telephone Encounter (Signed)
 Pt ready for scheduling for EVENITY on or after : 06/07/24  Option# 1 Buy/Bill (Office supplied medication)  Out-of-pocket cost due at time of  office visit: $507.41  Number of injection/visits approved: ---  Primary: [employer] Evenity co-insurance: 20% Admin fee co-insurance: 20%  Secondary: --- Evenity co-insurance:  Admin fee co-insurance:   Medical Benefit Details: Date Benefits were checked: 05/31/24 Deductible: $500 Met of $500 Required/ Coinsurance: 20%/ Admin Fee: 20%  Prior Auth: N/A PA# Expiration Date:   # of doses approved: ------------------------------------------------------------------------- Option# 2- Med Obtained from pharmacy  Pharmacy benefit: Copay $--- MUST FILL AT [facility] (Paid to pharmacy) Admin Fee: 20% (Pay at clinic)  Prior Auth: APPROVED PA# [id number] Expiration Date: 04/04/24-06/03/25  # of doses approved:   If patient wants fill through the pharmacy benefit please send prescription to: [facility] [phone]), and include estimated need by date in rx notes. Pharmacy will ship medication directly to the office.  Patient IS eligible for Evenity Copay Card. Copay Card can make patient's cost as little as $25. Link to apply: [email or web address]://www.[email or web address]   This summary of benefits is an estimation of the patient's out-of-pocket cost. Exact cost may very based on individual plan coverage.

## 2024-06-07 NOTE — Telephone Encounter (Signed)
 MEDICAL - NO PA REQUIRED

## 2024-06-11 ENCOUNTER — Telehealth: Payer: Self-pay

## 2024-06-11 DIAGNOSIS — M810 Age-related osteoporosis without current pathological fracture: Secondary | ICD-10-CM

## 2024-06-11 NOTE — Telephone Encounter (Signed)
 Left message for patient to call back. Notified patient that I will be out of office till tomorrow and will try to call her back again then.

## 2024-06-11 NOTE — Telephone Encounter (Signed)
 Copied from CRM #[id number]. Topic: General - Call Back - No Documentation >> Jun 11, 2024 12:14 PM Nessti S wrote: Reason for CRM: pt returning call to cma Kalob Bergen. Would like a call back number [phone]

## 2024-06-12 NOTE — Telephone Encounter (Signed)
 Left message for patient to return call regarding medication. I will call patient again after 1pm.

## 2024-06-12 NOTE — Telephone Encounter (Signed)
 I called and spoke with patient and documentation done under referral tab.

## 2024-06-27 ENCOUNTER — Other Ambulatory Visit

## 2024-06-27 DIAGNOSIS — Z006 Encounter for examination for normal comparison and control in clinical research program: Secondary | ICD-10-CM

## 2024-07-01 ENCOUNTER — Encounter: Payer: Self-pay | Admitting: Nurse Practitioner

## 2024-07-01 MED ORDER — LOSARTAN POTASSIUM-HCTZ 50-12.5 MG PO TABS: 1.0000 | ORAL_TABLET | Freq: Every day | ORAL | 1 refills | Status: AC

## 2024-07-01 NOTE — Telephone Encounter (Signed)
 Requesting: losartan -hydrochlorothiazide (HYZAAR) 50-12.5 MG tablet  Last Visit: 05/14/2024 Next Visit: 11/12/2024 Last Refill: 05/14/2024  Please Advise

## 2024-07-08 LAB — GENECONNECT MOLECULAR SCREEN

## 2024-07-16 MED ORDER — ROMOSOZUMAB-AQQG 105 MG/1.17ML ~~LOC~~ SOSY: 210.0000 mg | PREFILLED_SYRINGE | Freq: Once | SUBCUTANEOUS | 0 refills | Status: AC

## 2024-07-16 NOTE — Addendum Note (Signed)
 Addended by: ALESSANDRA DEDRA PARAS on: 07/16/2024 09:59 AM   Modules accepted: Orders

## 2024-07-16 NOTE — Telephone Encounter (Signed)
 The address to locate [facility] in [facility] is [address] [place], [place] [address]. Prescription sent.

## 2024-07-22 NOTE — Telephone Encounter (Signed)
 In coming fax from [facility] notifying that the prescription for Evenity  105mg  Prefilled Syringe 1.8ml has been received and is in process.

## 2024-07-30 ENCOUNTER — Ambulatory Visit

## 2024-07-30 DIAGNOSIS — M810 Age-related osteoporosis without current pathological fracture: Secondary | ICD-10-CM

## 2024-07-30 MED ORDER — ROMOSOZUMAB-AQQG 105 MG/1.17ML ~~LOC~~ SOSY: 210.0000 mg | PREFILLED_SYRINGE | Freq: Once | SUBCUTANEOUS | Status: DC

## 2024-07-30 MED ORDER — EVENITY 105 MG/1.17ML ~~LOC~~ SOSY: 210.0000 mg | PREFILLED_SYRINGE | Freq: Once | SUBCUTANEOUS | 0 refills | Status: DC

## 2024-07-30 NOTE — Progress Notes (Addendum)
 After obtaining consent, and per orders of Dr. Tinnie Harada, injection of Evenity  210mg  (2 syringes of 105mg  each) given SubQ in the right and left upper arms by Dedra PARAS [place]. Patient instructed to remain in clinic for 20 minutes afterwards, and to report any adverse reaction to me immediately. Given the paperwork that came with the medication. Pt tolerated well. Pt instructed to schedule next injection 30 days from today.  [facility] [id number] LOT [id number] Exp 11/22/2026 Patient-Supplied medication

## 2024-07-30 NOTE — Telephone Encounter (Signed)
Evenity arrived to the office.

## 2024-08-01 ENCOUNTER — Telehealth: Payer: Self-pay

## 2024-08-01 ENCOUNTER — Other Ambulatory Visit (HOSPITAL_COMMUNITY): Payer: Self-pay

## 2024-08-01 NOTE — Telephone Encounter (Signed)
 Evenity  VOB initiated via [email or web address]  Last Evenity  inj: 07/30/24 Next Evenity  inj DUE: 08/30/24

## 2024-08-09 ENCOUNTER — Other Ambulatory Visit (HOSPITAL_COMMUNITY): Payer: Self-pay

## 2024-08-09 NOTE — Telephone Encounter (Signed)
 Pt ready for scheduling for EVENITY  on or after : 08/31/23  Option# 2- Med Obtained from pharmacy  Pharmacy benefit: Copay $ FILLED 07/26/24, NEXT FILL 08/12/24 (Paid to pharmacy) Admin Fee: 20% (Pay at clinic)  Prior Auth: APPROVED PA# [id number] Expiration Date: 04/04/24-06/03/25  # of doses approved:   If patient wants fill through the pharmacy benefit please send prescription to: ---, and include estimated need by date in rx notes. Pharmacy will ship medication directly to the office.  Patient IS eligible for Evenity  Copay Card. Copay Card can make patient's cost as little as $25. Link to apply: https://www.[email or web address]/copay   This summary of benefits is an estimation of the patient's out-of-pocket cost. Exact cost may very based on individual plan coverage.

## 2024-08-09 NOTE — Telephone Encounter (Signed)
 FILLED 07/26/24

## 2024-08-09 NOTE — Telephone Encounter (Signed)
 SABRA

## 2024-08-20 ENCOUNTER — Other Ambulatory Visit: Payer: Self-pay | Admitting: Nurse Practitioner

## 2024-08-20 NOTE — Telephone Encounter (Signed)
 Requesting: EVENITY  105 MG PRFLLD SYR 1.17 ML 2'S  Last Visit: 05/14/2024 Next Visit: 11/12/2024 Last Refill: 08/13/2024  Please Advise    I called [facility] to verify when Rx was last received and it was in December 2025.

## 2024-08-29 ENCOUNTER — Other Ambulatory Visit (HOSPITAL_COMMUNITY): Payer: Self-pay

## 2024-08-29 MED ORDER — ROMOSOZUMAB-AQQG 105 MG/1.17ML ~~LOC~~ SOSY
210.0000 mg | PREFILLED_SYRINGE | Freq: Once | SUBCUTANEOUS | Status: AC
  Administered 2024-07-30: 210 mg via SUBCUTANEOUS

## 2024-08-29 NOTE — Addendum Note (Signed)
 Addended by: ALESSANDRA DEDRA PARAS on: 08/29/2024 12:44 PM   Modules accepted: Orders

## 2024-09-04 ENCOUNTER — Inpatient Hospital Stay: Payer: BC Managed Care – PPO

## 2024-09-04 ENCOUNTER — Inpatient Hospital Stay: Payer: BC Managed Care – PPO | Admitting: Hematology

## 2024-09-04 ENCOUNTER — Inpatient Hospital Stay

## 2024-09-04 ENCOUNTER — Inpatient Hospital Stay: Admitting: Hematology

## 2024-11-12 ENCOUNTER — Encounter: Admitting: Nurse Practitioner
